# Patient Record
Sex: Female | Born: 1985 | Race: White | Hispanic: No | Smoking: Never smoker
Health system: Southern US, Community
[De-identification: ages and names within clinical notes are randomized; demographics above are authoritative.]

## PROBLEM LIST (undated history)

## (undated) DIAGNOSIS — Z789 Other specified health status: Secondary | ICD-10-CM

## (undated) DIAGNOSIS — R0609 Other forms of dyspnea: Secondary | ICD-10-CM

## (undated) DIAGNOSIS — R0600 Dyspnea, unspecified: Secondary | ICD-10-CM

## (undated) DIAGNOSIS — R011 Cardiac murmur, unspecified: Secondary | ICD-10-CM

## (undated) DIAGNOSIS — R001 Bradycardia, unspecified: Secondary | ICD-10-CM

## (undated) HISTORY — PX: TUBAL LIGATION: SHX77

## (undated) HISTORY — DX: Dyspnea, unspecified: R06.00

## (undated) HISTORY — DX: Other specified health status: Z78.9

## (undated) HISTORY — DX: Cardiac murmur, unspecified: R01.1

## (undated) HISTORY — DX: Bradycardia, unspecified: R00.1

## (undated) HISTORY — DX: Other forms of dyspnea: R06.09

## (undated) HISTORY — PX: APPENDECTOMY: SHX54

---

## 2019-02-24 ENCOUNTER — Other Ambulatory Visit: Payer: Self-pay | Admitting: Family Medicine

## 2019-02-24 DIAGNOSIS — N941 Unspecified dyspareunia: Secondary | ICD-10-CM

## 2019-04-08 NOTE — L&D Delivery Note (Signed)
Delivery Summary for Misty Chambers  Labor Events:   Preterm labor: No data found  Rupture date: 12/07/2019  Rupture time: 2:00 AM  Rupture type: Spontaneous  Fluid Color: Clear  Induction: No data found  Augmentation: No data found  Complications: No data found  Cervical ripening: No data found No data found   No data found     Delivery:   Episiotomy: No data found  Lacerations: No data found  Repair suture: No data found  Repair # of packets: No data found  Blood loss (ml): 205   Information for the patient's newborn:  Berklee, Battey [[id number]]    Delivery 12/07/2019 8:59 PM by  Vaginal, Spontaneous Sex:  female Gestational Age: [redacted]w[redacted]d Delivery Clinician:   Living?:         APGARS  One minute Five minutes Ten minutes  Skin color:        Heart rate:        Grimace:        Muscle tone:        Breathing:        Totals: 8  9      Presentation/position:      Resuscitation:   Cord information:    Disposition of cord blood:     Blood gases sent?  Complications:   Placenta: Delivered:       appearance Newborn Measurements: Weight: 8 lb 2.9 oz (3710 g)  Height: 20.28"  Head circumference:    Chest circumference:    Other providers:    Additional  information: Forceps:   Vacuum:   Breech:   Observed anomalies       Delivery Note At 8:59 PM a viable and healthy female was delivered via Vaginal, Spontaneous (Presentation: Right Occiput Anterior, Vertex).  APGAR: 8, 9; weight 3710 grams.   Placenta status: Spontaneous, Intact.  Cord: 3 vessels with the following complications: Nuchal cord x 1, loose, reducible after delivery of fetus.  Cord pH: not obtained.  Delayed cord clamping observed.  Cord blood obtained.   Anesthesia: Epidural Episiotomy: None Lacerations:  None Suture Repair: None Est. Blood Loss (mL): 205  Mom to postpartum.  Baby to Couplet care / Skin to Skin.  Hildred Laser, MD 12/07/2019, 9:27 PM

## 2019-04-14 ENCOUNTER — Ambulatory Visit: Payer: Medicaid Other | Attending: Internal Medicine

## 2019-04-14 DIAGNOSIS — Z20822 Contact with and (suspected) exposure to covid-19: Secondary | ICD-10-CM

## 2019-04-15 ENCOUNTER — Ambulatory Visit (INDEPENDENT_AMBULATORY_CARE_PROVIDER_SITE_OTHER): Payer: Medicaid Other | Admitting: Obstetrics and Gynecology

## 2019-04-15 ENCOUNTER — Encounter: Payer: Self-pay | Admitting: Obstetrics and Gynecology

## 2019-04-15 ENCOUNTER — Other Ambulatory Visit: Payer: Self-pay

## 2019-04-15 VITALS — BP 94/62 | HR 52 | Ht 64.0 in | Wt 150.8 lb

## 2019-04-15 DIAGNOSIS — N926 Irregular menstruation, unspecified: Secondary | ICD-10-CM | POA: Diagnosis not present

## 2019-04-15 LAB — POCT URINE PREGNANCY: Preg Test, Ur: POSITIVE — AB

## 2019-04-15 NOTE — Progress Notes (Signed)
PT is present today for confirmation of pregnancy. Pt LMP 02/21/19 unsure of date. UPT done today results were positive. Pt stated that she is doing well no complaints.

## 2019-04-15 NOTE — Progress Notes (Signed)
HPI:      [name] is a 34 y.o. G2P1001 who LMP was Patient's last menstrual period was 02/21/2019 (within days).  Subjective:   She presents today because she has missed 2 menstrual periods.  She did a positive pregnancy test at home on January 1.  She is not sure of her last menstrual period but believes it was sometime in mid November.  She is not currently taking prenatal vitamins.  She was not specifically attempting pregnancy. Her last pregnancy was approximately 10 years ago and was uncomplicated vaginal delivery. She did not breast-feed with the last pregnancy but she is willing to try this pregnancy.    Hx: The following portions of the patient's history were reviewed and updated as appropriate:             She  has a past medical history of No pertinent past medical history. She does not have a problem list on file. She  has a past surgical history that includes Appendectomy. Her family history includes COPD in her father; Diabetes in her mother; Heart attack in her paternal grandfather; Heart disease in her mother; Hepatitis in her sister. She  reports that she has never smoked. She has never used smokeless tobacco. She reports previous alcohol use. She reports previous drug use. Drug: Marijuana. She currently has no medications in their medication list. She has No Known Allergies.       Review of Systems:  Review of Systems  Constitutional: Denied constitutional symptoms, night sweats, recent illness, fatigue, fever, insomnia and weight loss.  Eyes: Denied eye symptoms, eye pain, photophobia, vision change and visual disturbance.  Ears/Nose/Throat/Neck: Denied ear, nose, throat or neck symptoms, hearing loss, nasal discharge, sinus congestion and sore throat.  Cardiovascular: Denied cardiovascular symptoms, arrhythmia, chest pain/pressure, edema, exercise intolerance, orthopnea and palpitations.  Respiratory: Denied pulmonary symptoms, asthma, pleuritic pain,  productive sputum, cough, dyspnea and wheezing.  Gastrointestinal: Denied, gastro-esophageal reflux, melena, nausea and vomiting.  Genitourinary: Denied genitourinary symptoms including symptomatic vaginal discharge, pelvic relaxation issues, and urinary complaints.  Musculoskeletal: Denied musculoskeletal symptoms, stiffness, swelling, muscle weakness and myalgia.  Dermatologic: Denied dermatology symptoms, rash and scar.  Neurologic: Denied neurology symptoms, dizziness, headache, neck pain and syncope.  Psychiatric: Denied psychiatric symptoms, anxiety and depression.  Endocrine: Denied endocrine symptoms including hot flashes and night sweats.   Meds:   No current outpatient medications on file prior to visit.   No current facility-administered medications on file prior to visit.    Objective:     Vitals:   04/15/19 1048  BP: 94/62  Pulse: (!) 52              Urinary pregnancy test positive  Assessment:    G2P1001 There are no problems to display for this patient.    1. Missed menses    Positive pregnancy test today in the office and a positive home test on January 1.   Plan:            Prenatal Plan 1.  The patient was given prenatal literature. 2.  She was begun on prenatal vitamins. 3.  A prenatal lab panel was ordered or drawn. 4.  An ultrasound was ordered to better determine an EDC. 5.  A nurse visit was scheduled. 6.  Genetic testing and testing for other inheritable conditions discussed in detail. She will decide in the future whether to have these labs performed. 7.  A general overview of pregnancy testing, visit schedule, ultrasound schedule,  and prenatal care was discussed. 8.  COVID and its risks associated with pregnancy, prevention by limiting exposure and use of masks, as well as the risks and benefits of vaccination during pregnancy were discussed in detail.  [facility] policy regarding office and hospital visitation and testing was explained. 9.   Benefits of breast-feeding discussed in detail including both maternal and infant benefits. Ready Set Baby website discussed.   Orders Orders Placed This Encounter  Procedures  . US OB Comp Less 14 Wks  . POCT urine pregnancy    No orders of the defined types were placed in this encounter.     F/U  Return in about 6 weeks (around 05/27/2019). I spent 32 minutes involved in the care of this patient preparing to see the patient by obtaining and reviewing her medical history (including labs, imaging tests and prior procedures), documenting clinical information in the electronic health record (EHR), writing and sending prescriptions, ordering tests or procedures and directly communicating with the patient discussing pertinent items from her history and physical exam as well as my assessment and plan as noted above.  All of her questions were answered.  Finis Bud, M.D. 04/15/2019 11:17 AM

## 2019-04-16 LAB — NOVEL CORONAVIRUS, NAA: SARS-CoV-2, NAA: NOT DETECTED

## 2019-04-27 NOTE — Progress Notes (Signed)
Misty Chambers presents for NOB nurse interview visit. Pregnancy confirmation done 04/15/19 DJE.  G-4 .  P-1 0 2 1. LMP 02/21/19. Pregnancy education material explained and given. 0 cats in the home. NOB labs ordered. HIV labs and Drug screen were explained optional and she did not decline. Drug screen ordered. PNV encouraged. Genetic screening options discussed. Genetic testing: Ordered. FMLA and financial papers reviewed and signed. Pt may discuss with provider. Pt. To follow up with provider in _4_ weeks for NOB physical.  All questions answered.

## 2019-04-28 ENCOUNTER — Ambulatory Visit (INDEPENDENT_AMBULATORY_CARE_PROVIDER_SITE_OTHER): Payer: Medicaid Other | Admitting: Obstetrics and Gynecology

## 2019-04-28 ENCOUNTER — Other Ambulatory Visit: Payer: Self-pay

## 2019-04-28 ENCOUNTER — Ambulatory Visit (INDEPENDENT_AMBULATORY_CARE_PROVIDER_SITE_OTHER): Payer: Medicaid Other

## 2019-04-28 VITALS — BP 116/72 | HR 73 | Ht 64.0 in | Wt 151.4 lb

## 2019-04-28 DIAGNOSIS — N926 Irregular menstruation, unspecified: Secondary | ICD-10-CM

## 2019-04-28 DIAGNOSIS — Z3687 Encounter for antenatal screening for uncertain dates: Secondary | ICD-10-CM

## 2019-04-28 DIAGNOSIS — Z3481 Encounter for supervision of other normal pregnancy, first trimester: Secondary | ICD-10-CM

## 2019-04-29 LAB — URINALYSIS, ROUTINE W REFLEX MICROSCOPIC
Bilirubin, UA: NEGATIVE
Glucose, UA: NEGATIVE
Ketones, UA: NEGATIVE
Nitrite, UA: NEGATIVE
Protein,UA: NEGATIVE
RBC, UA: NEGATIVE
Specific Gravity, UA: <=1.005 — AB (ref 1.005–1.030)
Urobilinogen, Ur: 0.2 mg/dL (ref 0.2–1.0)
pH, UA: 7 (ref 5.0–7.5)

## 2019-04-29 LAB — MICROSCOPIC EXAMINATION: Casts: NONE SEEN /lpf

## 2019-04-29 LAB — CBC WITH DIFFERENTIAL
Basophils Absolute: 0 10*3/uL (ref 0.0–0.2)
Basos: 0 %
EOS (ABSOLUTE): 0.1 10*3/uL (ref 0.0–0.4)
Eos: 2 %
Hematocrit: 37.5 % (ref 34.0–46.6)
Hemoglobin: 13.3 g/dL (ref 11.1–15.9)
Immature Grans (Abs): 0 10*3/uL (ref 0.0–0.1)
Immature Granulocytes: 0 %
Lymphocytes Absolute: 1.8 10*3/uL (ref 0.7–3.1)
Lymphs: 24 %
MCH: 32.8 pg (ref 26.6–33.0)
MCHC: 35.5 g/dL (ref 31.5–35.7)
MCV: 93 fL (ref 79–97)
Monocytes Absolute: 0.6 10*3/uL (ref 0.1–0.9)
Monocytes: 8 %
Neutrophils Absolute: 5 10*3/uL (ref 1.4–7.0)
Neutrophils: 66 %
RBC: 4.05 x10E6/uL (ref 3.77–5.28)
RDW: 12 % (ref 11.7–15.4)
WBC: 7.5 10*3/uL (ref 3.4–10.8)

## 2019-04-29 LAB — RUBELLA SCREEN: Rubella Antibodies, IGG: 1.14 index (ref >0.99)

## 2019-04-29 LAB — ABO AND RH: Rh Factor: POSITIVE

## 2019-04-29 LAB — HIV ANTIBODY (ROUTINE TESTING W REFLEX): HIV Screen 4th Generation wRfx: NONREACTIVE

## 2019-04-29 LAB — HEPATITIS B SURFACE ANTIGEN: Hepatitis B Surface Ag: NEGATIVE

## 2019-04-29 LAB — VARICELLA ZOSTER ANTIBODY, IGG: Varicella zoster IgG: >4000 index (ref >165)

## 2019-04-29 LAB — ANTIBODY SCREEN: Antibody Screen: NEGATIVE

## 2019-04-29 LAB — RPR: RPR Ser Ql: NONREACTIVE

## 2019-04-30 LAB — MONITOR DRUG PROFILE 14(MW)
Amphetamine Scrn, Ur: NEGATIVE ng/mL
BARBITURATE SCREEN URINE: NEGATIVE ng/mL
BENZODIAZEPINE SCREEN, URINE: NEGATIVE ng/mL
Buprenorphine, Urine: NEGATIVE ng/mL
CANNABINOIDS UR QL SCN: NEGATIVE ng/mL
Cocaine (Metab) Scrn, Ur: NEGATIVE ng/mL
Creatinine(Crt), U: 8.2 mg/dL — ABNORMAL LOW (ref 20.0–300.0)
Fentanyl, Urine: NEGATIVE pg/mL
Meperidine Screen, Urine: NEGATIVE ng/mL
Methadone Screen, Urine: NEGATIVE ng/mL
OXYCODONE+OXYMORPHONE UR QL SCN: NEGATIVE ng/mL
Opiate Scrn, Ur: NEGATIVE ng/mL
Ph of Urine: 6.8 (ref 4.5–8.9)
Phencyclidine Qn, Ur: NEGATIVE ng/mL
Propoxyphene Scrn, Ur: NEGATIVE ng/mL
SPECIFIC GRAVITY: 1.002
Tramadol Screen, Urine: NEGATIVE ng/mL

## 2019-04-30 LAB — GC/CHLAMYDIA PROBE AMP
Chlamydia trachomatis, NAA: NEGATIVE
Neisseria Gonorrhoeae by PCR: NEGATIVE

## 2019-04-30 LAB — URINE CULTURE: Organism ID, Bacteria: NO GROWTH

## 2019-05-02 LAB — MATERNIT 21 PLUS CORE, BLOOD
Fetal Fraction: 9
Result (T21): NEGATIVE
Trisomy 13 (Patau syndrome): NEGATIVE
Trisomy 18 (Edwards syndrome): NEGATIVE
Trisomy 21 (Down syndrome): NEGATIVE

## 2019-05-24 ENCOUNTER — Other Ambulatory Visit: Payer: Self-pay

## 2019-05-24 ENCOUNTER — Encounter: Payer: Self-pay | Admitting: Obstetrics and Gynecology

## 2019-05-24 ENCOUNTER — Ambulatory Visit (INDEPENDENT_AMBULATORY_CARE_PROVIDER_SITE_OTHER): Payer: Medicaid Other | Admitting: Obstetrics and Gynecology

## 2019-05-24 VITALS — BP 116/81 | HR 70 | Wt 153.0 lb

## 2019-05-24 DIAGNOSIS — Z3481 Encounter for supervision of other normal pregnancy, first trimester: Secondary | ICD-10-CM | POA: Diagnosis not present

## 2019-05-24 DIAGNOSIS — Z3A12 12 weeks gestation of pregnancy: Secondary | ICD-10-CM

## 2019-05-24 LAB — POCT URINALYSIS DIPSTICK OB
Bilirubin, UA: NEGATIVE
Blood, UA: NEGATIVE
Glucose, UA: NEGATIVE
Ketones, UA: NEGATIVE
Nitrite, UA: NEGATIVE
Spec Grav, UA: 1.01 (ref 1.010–1.025)
Urobilinogen, UA: 0.2 E.U./dL
pH, UA: 5 (ref 5.0–8.0)

## 2019-05-24 NOTE — Progress Notes (Signed)
NOB: Nausea and vomiting has now subsided.  Patient taking prenatal vitamins as directed.  Denies other problems.  AFP next visit.  If patient continues to have increased WBCs in urine consider repeat culture.  Physical examination General NAD, Conversant  HEENT Atraumatic; Op clear with mmm.  Normo-cephalic. Pupils reactive. Anicteric sclerae  Thyroid/Neck Smooth without nodularity or enlargement. Normal ROM.  Neck Supple.  Skin No rashes, lesions or ulceration. Normal palpated skin turgor. No nodularity.  Breasts: No masses or discharge.  Symmetric.  No axillary adenopathy.  Lungs: Clear to auscultation.No rales or wheezes. Normal Respiratory effort, no retractions.  Heart: NSR.  No murmurs or rubs appreciated. No periferal edema  Abdomen: Soft.  Non-tender.  No masses.  No HSM. No hernia  Extremities: Moves all appropriately.  Normal ROM for age. No lymphadenopathy.  Neuro: Oriented to PPT.  Normal mood. Normal affect.     Pelvic:   Vulva: Normal appearance.  No lesions.  Vagina: No lesions or abnormalities noted.  Support: Normal pelvic support.  Urethra No masses tenderness or scarring.  Meatus Normal size without lesions or prolapse.  Cervix: Normal appearance.  No lesions.  Anus: Normal exam.  No lesions.  Perineum: Normal exam.  No lesions.        Bimanual   Adnexae: No masses.  Non-tender to palpation.  Uterus: Enlarged. 142bpm  Non-tender.  Mobile.  AV.  Adnexae: No masses.  Non-tender to palpation.  Cul-de-sac: Negative for abnormality.  Adnexae: No masses.  Non-tender to palpation.         Pelvimetry   Diagonal: Reached.  Spines: Average.  Sacrum: Concave.  Pubic Arch: Normal.

## 2019-06-08 ENCOUNTER — Other Ambulatory Visit: Payer: Self-pay

## 2019-06-08 MED ORDER — VITAFOL GUMMIES 3.33-0.333-34.8 MG PO CHEW: 34.8000 mg | CHEWABLE_TABLET | Freq: Every day | ORAL | 11 refills | Status: DC

## 2019-06-20 NOTE — Progress Notes (Addendum)
ROB-Pt present for routine prenatal care. Pt stated that she was doing go other than having gas off and on. Pt stated that she takes gas x and it help a lot. Ready set baby completed today. Pt declined flu vaccine.

## 2019-06-20 NOTE — Patient Instructions (Signed)
Second Trimester of Pregnancy  The second trimester is from week 14 through week 27 (month 4 through 6). This is often the time in pregnancy that you feel your best. Often times, morning sickness has lessened or quit. You may have more energy, and you may get hungry more often. Your unborn baby is growing rapidly. At the end of the sixth month, he or she is about 9 inches long and weighs about 1 pounds. You will likely feel the baby move between 18 and 20 weeks of pregnancy. Follow these instructions at home: Medicines  Take over-the-counter and prescription medicines only as told by your doctor. Some medicines are safe and some medicines are not safe during pregnancy.  Take a prenatal vitamin that contains at least 600 micrograms (mcg) of folic acid.  If you have trouble pooping (constipation), take medicine that will make your stool soft (stool softener) if your doctor approves. Eating and drinking   Eat regular, healthy meals.  Avoid raw meat and uncooked cheese.  If you get low calcium from the food you eat, talk to your doctor about taking a daily calcium supplement.  Avoid foods that are high in fat and sugars, such as fried and sweet foods.  If you feel sick to your stomach (nauseous) or throw up (vomit): ? Eat 4 or 5 small meals a day instead of 3 large meals. ? Try eating a few soda crackers. ? Drink liquids between meals instead of during meals.  To prevent constipation: ? Eat foods that are high in fiber, like fresh fruits and vegetables, whole grains, and beans. ? Drink enough fluids to keep your pee (urine) clear or pale yellow. Activity  Exercise only as told by your doctor. Stop exercising if you start to have cramps.  Do not exercise if it is too hot, too humid, or if you are in a place of great height (high altitude).  Avoid heavy lifting.  Wear low-heeled shoes. Sit and stand up straight.  You can continue to have sex unless your doctor tells you not  to. Relieving pain and discomfort  Wear a good support bra if your breasts are tender.  Take warm water baths (sitz baths) to soothe pain or discomfort caused by hemorrhoids. Use hemorrhoid cream if your doctor approves.  Rest with your legs raised if you have leg cramps or low back pain.  If you develop puffy, bulging veins (varicose veins) in your legs: ? Wear support hose or compression stockings as told by your doctor. ? Raise (elevate) your feet for 15 minutes, 3-4 times a day. ? Limit salt in your food. Prenatal care  Write down your questions. Take them to your prenatal visits.  Keep all your prenatal visits as told by your doctor. This is important. Safety  Wear your seat belt when driving.  Make a list of emergency phone numbers, including numbers for family, friends, the hospital, and police and fire departments. General instructions  Ask your doctor about the right foods to eat or for help finding a counselor, if you need these services.  Ask your doctor about local prenatal classes. Begin classes before month 6 of your pregnancy.  Do not use hot tubs, steam rooms, or saunas.  Do not douche or use tampons or scented sanitary pads.  Do not cross your legs for long periods of time.  Visit your dentist if you have not done so. Use a soft toothbrush to brush your teeth. Floss gently.  Avoid all smoking, herbs,   and alcohol. Avoid drugs that are not approved by your doctor.  Do not use any products that contain nicotine or tobacco, such as cigarettes and e-cigarettes. If you need help quitting, ask your doctor.  Avoid cat litter boxes and soil used by cats. These carry germs that can cause birth defects in the baby and can cause a loss of your baby (miscarriage) or stillbirth. Contact a doctor if:  You have mild cramps or pressure in your lower belly.  You have pain when you pee (urinate).  You have bad smelling fluid coming from your vagina.  You continue to  feel sick to your stomach (nauseous), throw up (vomit), or have watery poop (diarrhea).  You have a nagging pain in your belly area.  You feel dizzy. Get help right away if:  You have a fever.  You are leaking fluid from your vagina.  You have spotting or bleeding from your vagina.  You have severe belly cramping or pain.  You lose or gain weight rapidly.  You have trouble catching your breath and have chest pain.  You notice sudden or extreme puffiness (swelling) of your face, hands, ankles, feet, or legs.  You have not felt the baby move in over an hour.  You have severe headaches that do not go away when you take medicine.  You have trouble seeing. Summary  The second trimester is from week 14 through week 27 (months 4 through 6). This is often the time in pregnancy that you feel your best.  To take care of yourself and your unborn baby, you will need to eat healthy meals, take medicines only if your doctor tells you to do so, and do activities that are safe for you and your baby.  Call your doctor if you get sick or if you notice anything unusual about your pregnancy. Also, call your doctor if you need help with the right food to eat, or if you want to know what activities are safe for you. This information is not intended to replace advice given to you by your health care provider. Make sure you discuss any questions you have with your health care provider. Document Revised: 07/16/2018 Document Reviewed: 04/29/2016 Elsevier Patient Education  [place]. Common Medications Safe in Pregnancy  Acne:      Constipation:  Benzoyl Peroxide     Colace  Clindamycin      Dulcolax Suppository  Topica Erythromycin     Fibercon  Salicylic Acid      Metamucil         Miralax AVOID:        Senakot   Accutane    Cough:  Retin-A       Cough Drops  Tetracycline      Phenergan w/ Codeine if Rx  Minocycline      Robitussin (Plain &  DM)  Antibiotics:     Crabs/Lice:  Ceclor       RID  Cephalosporins    AVOID:  E-Mycins      Kwell  Keflex  Macrobid/Macrodantin   Diarrhea:  Penicillin      Kao-Pectate  Zithromax      Imodium AD         PUSH FLUIDS AVOID:       Cipro     Fever:  Tetracycline      Tylenol (Regular or Extra  Minocycline       Strength)  Levaquin      Extra Strength-Do not  Exceed 8 tabs/24 hrs Caffeine:        <235m/day (equiv. To 1 cup of coffee or  approx. 3 12 oz sodas)         Gas: Cold/Hayfever:       Gas-X  Benadryl      Mylicon  Claritin       Phazyme  **Claritin-D        Chlor-Trimeton    Headaches:  Dimetapp      ASA-Free Excedrin  Drixoral-Non-Drowsy     Cold Compress  Mucinex (Guaifenasin)     Tylenol (Regular or Extra  Sudafed/Sudafed-12 Hour     Strength)  **Sudafed PE Pseudoephedrine   Tylenol Cold & Sinus     Vicks Vapor Rub  Zyrtec  **AVOID if Problems With Blood Pressure         Heartburn: Avoid lying down for at least 1 hour after meals  Aciphex      Maalox     Rash:  Milk of Magnesia     Benadryl    Mylanta       1% Hydrocortisone Cream  Pepcid  Pepcid Complete   Sleep Aids:  Prevacid      Ambien   Prilosec       Benadryl  Rolaids       Chamomile Tea  Tums (Limit 4/day)     Unisom  Zantac       Tylenol PM         Warm milk-add vanilla or  Hemorrhoids:       Sugar for taste  Anusol/Anusol H.C.  (RX: Analapram 2.5%)  Sugar Substitutes:  Hydrocortisone OTC     Ok in moderation  Preparation H      Tucks        Vaseline lotion applied to tissue with wiping    Herpes:     Throat:  Acyclovir      Oragel  Famvir  Valtrex     Vaccines:         Flu Shot Leg Cramps:       *Gardasil  Benadryl      Hepatitis A         Hepatitis B Nasal Spray:       Pneumovax  Saline Nasal Spray     Polio Booster         Tetanus Nausea:       Tuberculosis test or PPD  Vitamin B6 25 mg TID   AVOID:    Dramamine      *Gardasil  Emetrol       Live  Poliovirus  Ginger Root 250 mg QID    MMR (measles, mumps &  High Complex Carbs @ Bedtime    rebella)  Sea Bands-Accupressure    Varicella (Chickenpox)  Unisom 1/2 tab TID     *No known complications           If received before Pain:         Known pregnancy;   Darvocet       Resume series after  Lortab        Delivery  Percocet    Yeast:   Tramadol      Femstat  Tylenol 3      Gyne-lotrimin  Ultram       Monistat  Vicodin           MISC:         All Sunscreens  Hair Coloring/highlights          Insect Repellant's          (Including DEET)         Mystic Tans  

## 2019-06-21 ENCOUNTER — Ambulatory Visit (INDEPENDENT_AMBULATORY_CARE_PROVIDER_SITE_OTHER): Payer: Medicaid Other | Admitting: Obstetrics and Gynecology

## 2019-06-21 ENCOUNTER — Encounter: Payer: Self-pay | Admitting: Obstetrics and Gynecology

## 2019-06-21 ENCOUNTER — Other Ambulatory Visit: Payer: Self-pay

## 2019-06-21 VITALS — BP 104/60 | HR 70 | Wt 157.1 lb

## 2019-06-21 DIAGNOSIS — Z3A16 16 weeks gestation of pregnancy: Secondary | ICD-10-CM

## 2019-06-21 DIAGNOSIS — Z8669 Personal history of other diseases of the nervous system and sense organs: Secondary | ICD-10-CM

## 2019-06-21 DIAGNOSIS — O09522 Supervision of elderly multigravida, second trimester: Secondary | ICD-10-CM

## 2019-06-21 DIAGNOSIS — O2612 Low weight gain in pregnancy, second trimester: Secondary | ICD-10-CM | POA: Insufficient documentation

## 2019-06-21 DIAGNOSIS — Z1379 Encounter for other screening for genetic and chromosomal anomalies: Secondary | ICD-10-CM

## 2019-06-21 LAB — POCT URINALYSIS DIPSTICK OB
Bilirubin, UA: NEGATIVE
Blood, UA: NEGATIVE
Glucose, UA: NEGATIVE
Ketones, UA: NEGATIVE
Nitrite, UA: NEGATIVE
POC,PROTEIN,UA: NEGATIVE
Spec Grav, UA: 1.015 (ref 1.010–1.025)
Urobilinogen, UA: 0.2 E.U./dL
pH, UA: 6 (ref 5.0–8.0)

## 2019-06-21 NOTE — Progress Notes (Signed)
ROB: Patient doing well no major complaints. Does note some increased gas, taking gas-x.  Has questions regarding sciatic pain in pregnancy as she has a history of it in the past. All questions answered.  For AFP today. RTC in 4 weeks, due for anatomy scan. Declines flu vaccine.

## 2019-06-23 LAB — AFP, SERUM, OPEN SPINA BIFIDA
AFP MoM: 0.63
AFP Value: 21.4 ng/mL
Gest. Age on Collection Date: 16.4 weeks
Maternal Age At EDD: 35.4 yr
OSBR Risk 1 IN: 10000
Test Results:: NEGATIVE
Weight: 157 [lb_av]

## 2019-07-19 ENCOUNTER — Telehealth: Payer: Self-pay | Admitting: Obstetrics and Gynecology

## 2019-07-19 ENCOUNTER — Ambulatory Visit (INDEPENDENT_AMBULATORY_CARE_PROVIDER_SITE_OTHER): Payer: Medicaid Other | Admitting: Obstetrics and Gynecology

## 2019-07-19 ENCOUNTER — Other Ambulatory Visit: Payer: Self-pay

## 2019-07-19 ENCOUNTER — Ambulatory Visit (INDEPENDENT_AMBULATORY_CARE_PROVIDER_SITE_OTHER): Payer: Medicaid Other

## 2019-07-19 VITALS — BP 107/69 | HR 54 | Wt 157.0 lb

## 2019-07-19 DIAGNOSIS — O09522 Supervision of elderly multigravida, second trimester: Secondary | ICD-10-CM

## 2019-07-19 DIAGNOSIS — Z3A20 20 weeks gestation of pregnancy: Secondary | ICD-10-CM | POA: Diagnosis not present

## 2019-07-19 LAB — POCT URINALYSIS DIPSTICK OB
Bilirubin, UA: NEGATIVE
Blood, UA: NEGATIVE
Glucose, UA: NEGATIVE
Ketones, UA: NEGATIVE
Nitrite, UA: NEGATIVE
Spec Grav, UA: 1.02 (ref 1.010–1.025)
Urobilinogen, UA: 0.2 E.U./dL
pH, UA: 6.5 (ref 5.0–8.0)

## 2019-07-19 NOTE — Telephone Encounter (Signed)
Notified patient that she has refills at her pharmacy. She will need to call them.

## 2019-07-19 NOTE — Telephone Encounter (Signed)
Pt was checking out and was wanting a refil of her VITAFOL GUMMIES sent to [facility] on [address]. Please advise

## 2019-07-19 NOTE — Progress Notes (Signed)
ROB: Doing well-without issues.  No complaints.  FAS today-normal anatomy.

## 2019-08-08 ENCOUNTER — Ambulatory Visit: Payer: Medicaid Other | Attending: Internal Medicine

## 2019-08-08 DIAGNOSIS — Z20822 Contact with and (suspected) exposure to covid-19: Secondary | ICD-10-CM

## 2019-08-09 LAB — NOVEL CORONAVIRUS, NAA: SARS-CoV-2, NAA: NOT DETECTED

## 2019-08-09 LAB — SARS-COV-2, NAA 2 DAY TAT

## 2019-08-15 NOTE — Patient Instructions (Addendum)
Second Trimester of Pregnancy  The second trimester is from week 14 through week 27 (month 4 through 6). This is often the time in pregnancy that you feel your best. Often times, morning sickness has lessened or quit. You may have more energy, and you may get hungry more often. Your unborn baby is growing rapidly. At the end of the sixth month, he or she is about 9 inches long and weighs about 1 pounds. You will likely feel the baby move between 18 and 20 weeks of pregnancy. Follow these instructions at home: Medicines  Take over-the-counter and prescription medicines only as told by your doctor. Some medicines are safe and some medicines are not safe during pregnancy.  Take a prenatal vitamin that contains at least 600 micrograms (mcg) of folic acid.  If you have trouble pooping (constipation), take medicine that will make your stool soft (stool softener) if your doctor approves. Eating and drinking   Eat regular, healthy meals.  Avoid raw meat and uncooked cheese.  If you get low calcium from the food you eat, talk to your doctor about taking a daily calcium supplement.  Avoid foods that are high in fat and sugars, such as fried and sweet foods.  If you feel sick to your stomach (nauseous) or throw up (vomit): ? Eat 4 or 5 small meals a day instead of 3 large meals. ? Try eating a few soda crackers. ? Drink liquids between meals instead of during meals.  To prevent constipation: ? Eat foods that are high in fiber, like fresh fruits and vegetables, whole grains, and beans. ? Drink enough fluids to keep your pee (urine) clear or pale yellow. Activity  Exercise only as told by your doctor. Stop exercising if you start to have cramps.  Do not exercise if it is too hot, too humid, or if you are in a place of great height (high altitude).  Avoid heavy lifting.  Wear low-heeled shoes. Sit and stand up straight.  You can continue to have sex unless your doctor tells you not  to. Relieving pain and discomfort  Wear a good support bra if your breasts are tender.  Take warm water baths (sitz baths) to soothe pain or discomfort caused by hemorrhoids. Use hemorrhoid cream if your doctor approves.  Rest with your legs raised if you have leg cramps or low back pain.  If you develop puffy, bulging veins (varicose veins) in your legs: ? Wear support hose or compression stockings as told by your doctor. ? Raise (elevate) your feet for 15 minutes, 3-4 times a day. ? Limit salt in your food. Prenatal care  Write down your questions. Take them to your prenatal visits.  Keep all your prenatal visits as told by your doctor. This is important. Safety  Wear your seat belt when driving.  Make a list of emergency phone numbers, including numbers for family, friends, the hospital, and police and fire departments. General instructions  Ask your doctor about the right foods to eat or for help finding a counselor, if you need these services.  Ask your doctor about local prenatal classes. Begin classes before month 6 of your pregnancy.  Do not use hot tubs, steam rooms, or saunas.  Do not douche or use tampons or scented sanitary pads.  Do not cross your legs for long periods of time.  Visit your dentist if you have not done so. Use a soft toothbrush to brush your teeth. Floss gently.  Avoid all smoking, herbs,   and alcohol. Avoid drugs that are not approved by your doctor.  Do not use any products that contain nicotine or tobacco, such as cigarettes and e-cigarettes. If you need help quitting, ask your doctor.  Avoid cat litter boxes and soil used by cats. These carry germs that can cause birth defects in the baby and can cause a loss of your baby (miscarriage) or stillbirth. Contact a doctor if:  You have mild cramps or pressure in your lower belly.  You have pain when you pee (urinate).  You have bad smelling fluid coming from your vagina.  You continue to  feel sick to your stomach (nauseous), throw up (vomit), or have watery poop (diarrhea).  You have a nagging pain in your belly area.  You feel dizzy. Get help right away if:  You have a fever.  You are leaking fluid from your vagina.  You have spotting or bleeding from your vagina.  You have severe belly cramping or pain.  You lose or gain weight rapidly.  You have trouble catching your breath and have chest pain.  You notice sudden or extreme puffiness (swelling) of your face, hands, ankles, feet, or legs.  You have not felt the baby move in over an hour.  You have severe headaches that do not go away when you take medicine.  You have trouble seeing. Summary  The second trimester is from week 14 through week 27 (months 4 through 6). This is often the time in pregnancy that you feel your best.  To take care of yourself and your unborn baby, you will need to eat healthy meals, take medicines only if your doctor tells you to do so, and do activities that are safe for you and your baby.  Call your doctor if you get sick or if you notice anything unusual about your pregnancy. Also, call your doctor if you need help with the right food to eat, or if you want to know what activities are safe for you. This information is not intended to replace advice given to you by your health care provider. Make sure you discuss any questions you have with your health care provider. Document Revised: 07/16/2018 Document Reviewed: 04/29/2016 Elsevier Patient Education  2020 [employer] Common Medications Safe in Pregnancy  Acne:      Constipation:  Benzoyl Peroxide     Colace  Clindamycin      Dulcolax Suppository  Topica Erythromycin     Fibercon  Salicylic Acid      Metamucil         Miralax AVOID:        Senakot   Accutane    Cough:  Retin-A       Cough Drops  Tetracycline      Phenergan w/ Codeine if Rx  Minocycline      Robitussin (Plain &  DM)  Antibiotics:     Crabs/Lice:  Ceclor       RID  Cephalosporins    AVOID:  E-Mycins      Kwell  Keflex  Macrobid/Macrodantin   Diarrhea:  Penicillin      Kao-Pectate  Zithromax      Imodium AD         PUSH FLUIDS AVOID:       Cipro     Fever:  Tetracycline      Tylenol (Regular or Extra  Minocycline       Strength)  Levaquin      Extra Strength-Do not            Exceed 8 tabs/24 hrs Caffeine:        <235m/day (equiv. To 1 cup of coffee or  approx. 3 12 oz sodas)         Gas: Cold/Hayfever:       Gas-X  Benadryl      Mylicon  Claritin       Phazyme  **Claritin-D        Chlor-Trimeton    Headaches:  Dimetapp      ASA-Free Excedrin  Drixoral-Non-Drowsy     Cold Compress  Mucinex (Guaifenasin)     Tylenol (Regular or Extra  Sudafed/Sudafed-12 Hour     Strength)  **Sudafed PE Pseudoephedrine   Tylenol Cold & Sinus     Vicks Vapor Rub  Zyrtec  **AVOID if Problems With Blood Pressure         Heartburn: Avoid lying down for at least 1 hour after meals  Aciphex      Maalox     Rash:  Milk of Magnesia     Benadryl    Mylanta       1% Hydrocortisone Cream  Pepcid  Pepcid Complete   Sleep Aids:  Prevacid      Ambien   Prilosec       Benadryl  Rolaids       Chamomile Tea  Tums (Limit 4/day)     Unisom  Zantac       Tylenol PM         Warm milk-add vanilla or  Hemorrhoids:       Sugar for taste  Anusol/Anusol H.C.  (RX: Analapram 2.5%)  Sugar Substitutes:  Hydrocortisone OTC     Ok in moderation  Preparation H      Tucks        Vaseline lotion applied to tissue with wiping    Herpes:     Throat:  Acyclovir      Oragel  Famvir  Valtrex     Vaccines:         Flu Shot Leg Cramps:       *Gardasil  Benadryl      Hepatitis A         Hepatitis B Nasal Spray:       Pneumovax  Saline Nasal Spray     Polio Booster         Tetanus Nausea:       Tuberculosis test or PPD  Vitamin B6 25 mg TID   AVOID:    Dramamine      *Gardasil  Emetrol       Live  Poliovirus  Ginger Root 250 mg QID    MMR (measles, mumps &  High Complex Carbs @ Bedtime    rebella)  Sea Bands-Accupressure    Varicella (Chickenpox)  Unisom 1/2 tab TID     *No known complications           If received before Pain:         Known pregnancy;   Darvocet       Resume series after  Lortab        Delivery  Percocet    Yeast:   Tramadol      Femstat  Tylenol 3      Gyne-lotrimin  Ultram       Monistat  Vicodin           MISC:         All Sunscreens  Hair Coloring/highlights          Insect Repellant's          (Including DEET)         Mystic Tans Breastfeeding  Choosing to breastfeed is one of the best decisions you can make for yourself and your baby. A change in hormones during pregnancy causes your breasts to make breast milk in your milk-producing glands. Hormones prevent breast milk from being released before your baby is born. They also prompt milk flow after birth. Once breastfeeding has begun, thoughts of your baby, as well as his or her sucking or crying, can stimulate the release of milk from your milk-producing glands. Benefits of breastfeeding Research shows that breastfeeding offers many health benefits for infants and mothers. It also offers a cost-free and convenient way to feed your baby. For your baby  Your first milk (colostrum) helps your baby's digestive system to function better.  Special cells in your milk (antibodies) help your baby to fight off infections.  Breastfed babies are less likely to develop asthma, allergies, obesity, or type 2 diabetes. They are also at lower risk for sudden infant death syndrome (SIDS).  Nutrients in breast milk are better able to meet your baby's needs compared to infant formula.  Breast milk improves your baby's brain development. For you  Breastfeeding helps to create a very special bond between you and your baby.  Breastfeeding is convenient. Breast milk costs nothing and is always available at the  correct temperature.  Breastfeeding helps to burn calories. It helps you to lose the weight that you gained during pregnancy.  Breastfeeding makes your uterus return faster to its size before pregnancy. It also slows bleeding (lochia) after you give birth.  Breastfeeding helps to lower your risk of developing type 2 diabetes, osteoporosis, rheumatoid arthritis, cardiovascular disease, and breast, ovarian, uterine, and endometrial cancer later in life. Breastfeeding basics Starting breastfeeding  Find a comfortable place to sit or lie down, with your neck and back well-supported.  Place a pillow or a rolled-up blanket under your baby to bring him or her to the level of your breast (if you are seated). Nursing pillows are specially designed to help support your arms and your baby while you breastfeed.  Make sure that your baby's tummy (abdomen) is facing your abdomen.  Gently massage your breast. With your fingertips, massage from the outer edges of your breast inward toward the nipple. This encourages milk flow. If your milk flows slowly, you may need to continue this action during the feeding.  Support your breast with 4 fingers underneath and your thumb above your nipple (make the letter "C" with your hand). Make sure your fingers are well away from your nipple and your baby's mouth.  Stroke your baby's lips gently with your finger or nipple.  When your baby's mouth is open wide enough, quickly bring your baby to your breast, placing your entire nipple and as much of the areola as possible into your baby's mouth. The areola is the colored area around your nipple. ? More areola should be visible above your baby's upper lip than below the lower lip. ? Your baby's lips should be opened and extended outward (flanged) to ensure an adequate, comfortable latch. ? Your baby's tongue should be between his or her lower gum and your breast.  Make sure that your baby's mouth is correctly positioned  around your nipple (latched). Your baby's lips should create a seal on your breast and be turned   out (everted).  It is common for your baby to suck about 2-3 minutes in order to start the flow of breast milk. Latching Teaching your baby how to latch onto your breast properly is very important. An improper latch can cause nipple pain, decreased milk supply, and poor weight gain in your baby. Also, if your baby is not latched onto your nipple properly, he or she may swallow some air during feeding. This can make your baby fussy. Burping your baby when you switch breasts during the feeding can help to get rid of the air. However, teaching your baby to latch on properly is still the best way to prevent fussiness from swallowing air while breastfeeding. Signs that your baby has successfully latched onto your nipple  Silent tugging or silent sucking, without causing you pain. Infant's lips should be extended outward (flanged).  Swallowing heard between every 3-4 sucks once your milk has started to flow (after your let-down milk reflex occurs).  Muscle movement above and in front of his or her ears while sucking. Signs that your baby has not successfully latched onto your nipple  Sucking sounds or smacking sounds from your baby while breastfeeding.  Nipple pain. If you think your baby has not latched on correctly, slip your finger into the corner of your baby's mouth to break the suction and place it between your baby's gums. Attempt to start breastfeeding again. Signs of successful breastfeeding Signs from your baby  Your baby will gradually decrease the number of sucks or will completely stop sucking.  Your baby will fall asleep.  Your baby's body will relax.  Your baby will retain a small amount of milk in his or her mouth.  Your baby will let go of your breast by himself or herself. Signs from you  Breasts that have increased in firmness, weight, and size 1-3 hours after  feeding.  Breasts that are softer immediately after breastfeeding.  Increased milk volume, as well as a change in milk consistency and color by the fifth day of breastfeeding.  Nipples that are not sore, cracked, or bleeding. Signs that your baby is getting enough milk  Wetting at least 1-2 diapers during the first 24 hours after birth.  Wetting at least 5-6 diapers every 24 hours for the first week after birth. The urine should be clear or pale yellow by the age of 5 days.  Wetting 6-8 diapers every 24 hours as your baby continues to grow and develop.  At least 3 stools in a 24-hour period by the age of 5 days. The stool should be soft and yellow.  At least 3 stools in a 24-hour period by the age of 7 days. The stool should be seedy and yellow.  No loss of weight greater than 10% of birth weight during the first 3 days of life.  Average weight gain of 4-7 oz (113-198 g) per week after the age of 4 days.  Consistent daily weight gain by the age of 5 days, without weight loss after the age of 2 weeks. After a feeding, your baby may spit up a small amount of milk. This is normal. Breastfeeding frequency and duration Frequent feeding will help you make more milk and can prevent sore nipples and extremely full breasts (breast engorgement). Breastfeed when you feel the need to reduce the fullness of your breasts or when your baby shows signs of hunger. This is called "breastfeeding on demand." Signs that your baby is hungry include:  Increased alertness, activity,   or restlessness.  Movement of the head from side to side.  Opening of the mouth when the corner of the mouth or cheek is stroked (rooting).  Increased sucking sounds, smacking lips, cooing, sighing, or squeaking.  Hand-to-mouth movements and sucking on fingers or hands.  Fussing or crying. Avoid introducing a pacifier to your baby in the first 4-6 weeks after your baby is born. After this time, you may choose to use a  pacifier. Research has shown that pacifier use during the first year of a baby's life decreases the risk of sudden infant death syndrome (SIDS). Allow your baby to feed on each breast as long as he or she wants. When your baby unlatches or falls asleep while feeding from the first breast, offer the second breast. Because newborns are often sleepy in the first few weeks of life, you may need to awaken your baby to get him or her to feed. Breastfeeding times will vary from baby to baby. However, the following rules can serve as a guide to help you make sure that your baby is properly fed:  Newborns (babies 4 weeks of age or younger) may breastfeed every 1-3 hours.  Newborns should not go without breastfeeding for longer than 3 hours during the day or 5 hours during the night.  You should breastfeed your baby a minimum of 8 times in a 24-hour period. Breast milk pumping     Pumping and storing breast milk allows you to make sure that your baby is exclusively fed your breast milk, even at times when you are unable to breastfeed. This is especially important if you go back to work while you are still breastfeeding, or if you are not able to be present during feedings. Your lactation consultant can help you find a method of pumping that works best for you and give you guidelines about how long it is safe to store breast milk. Caring for your breasts while you breastfeed Nipples can become dry, cracked, and sore while breastfeeding. The following recommendations can help keep your breasts moisturized and healthy:  Avoid using soap on your nipples.  Wear a supportive bra designed especially for nursing. Avoid wearing underwire-style bras or extremely tight bras (sports bras).  Air-dry your nipples for 3-4 minutes after each feeding.  Use only cotton bra pads to absorb leaked breast milk. Leaking of breast milk between feedings is normal.  Use lanolin on your nipples after breastfeeding. Lanolin  helps to maintain your skin's normal moisture barrier. Pure lanolin is not harmful (not toxic) to your baby. You may also hand express a few drops of breast milk and gently massage that milk into your nipples and allow the milk to air-dry. In the first few weeks after giving birth, some women experience breast engorgement. Engorgement can make your breasts feel heavy, warm, and tender to the touch. Engorgement peaks within 3-5 days after you give birth. The following recommendations can help to ease engorgement:  Completely empty your breasts while breastfeeding or pumping. You may want to start by applying warm, moist heat (in the shower or with warm, water-soaked hand towels) just before feeding or pumping. This increases circulation and helps the milk flow. If your baby does not completely empty your breasts while breastfeeding, pump any extra milk after he or she is finished.  Apply ice packs to your breasts immediately after breastfeeding or pumping, unless this is too uncomfortable for you. To do this: ? Put ice in a plastic bag. ? Place a   Place a towel between your skin and the bag. ? Leave the ice on for 20 minutes, 2-3 times a day.  Make sure that your baby is latched on and positioned properly while breastfeeding. If engorgement persists after 48 hours of following these recommendations, contact your health care provider or a Science writer. Overall health care recommendations while breastfeeding  Eat 3 healthy meals and 3 snacks every day. Well-nourished mothers who are breastfeeding need an additional 450-500 calories a day. You can meet this requirement by increasing the amount of a balanced diet that you eat.  Drink enough water to keep your urine pale yellow or clear.  Rest often, relax, and continue to take your prenatal vitamins to prevent fatigue, stress, and low vitamin and mineral levels in your body (nutrient deficiencies).  Do not use any products that contain nicotine or  tobacco, such as cigarettes and e-cigarettes. Your baby may be harmed by chemicals from cigarettes that pass into breast milk and exposure to secondhand smoke. If you need help quitting, ask your health care provider.  Avoid alcohol.  Do not use illegal drugs or marijuana.  Talk with your health care provider before taking any medicines. These include over-the-counter and prescription medicines as well as vitamins and herbal supplements. Some medicines that may be harmful to your baby can pass through breast milk.  It is possible to become pregnant while breastfeeding. If birth control is desired, ask your health care provider about options that will be safe while breastfeeding your baby. Where to find more information: [employer]: www.llli.org Contact a health care provider if:  You feel like you want to stop breastfeeding or have become frustrated with breastfeeding.  Your nipples are cracked or bleeding.  Your breasts are red, tender, or warm.  You have: ? Painful breasts or nipples. ? A swollen area on either breast. ? A fever or chills. ? Nausea or vomiting. ? Drainage other than breast milk from your nipples.  Your breasts do not become full before feedings by the fifth day after you give birth.  You feel sad and depressed.  Your baby is: ? Too sleepy to eat well. ? Having trouble sleeping. ? More than 42 week old and wetting fewer than 6 diapers in a 24-hour period. ? Not gaining weight by 66 days of age.  Your baby has fewer than 3 stools in a 24-hour period.  Your baby's skin or the white parts of his or her eyes become yellow. Get help right away if:  Your baby is overly tired (lethargic) and does not want to wake up and feed.  Your baby develops an unexplained fever. Summary  Breastfeeding offers many health benefits for infant and mothers.  Try to breastfeed your infant when he or she shows early signs of hunger.  Gently tickle or stroke  your baby's lips with your finger or nipple to allow the baby to open his or her mouth. Bring the baby to your breast. Make sure that much of the areola is in your baby's mouth. Offer one side and burp the baby before you offer the other side.  Talk with your health care provider or lactation consultant if you have questions or you face problems as you breastfeed. This information is not intended to replace advice given to you by your health care provider. Make sure you discuss any questions you have with your health care provider. Document Revised: 06/18/2017 Document Reviewed: 04/25/2016 Elsevier Patient Education  [place].  GESTATIONAL DIABETES TESTING FOR PREGNANCY  Pregnant women can develop a condition known as Gestational Diabetes (diabetes brought on by pregnancy) which can pose a risk to both mother and baby. A glucose tolerance test is a common type of testing for potential gestational diabetes.  There are several tests intended to identify gestational diabetes in pregnant women. The first, called the Glucose Challenge Screening, is a preliminary screening test performed between 26-28 weeks. If a woman tests positive during this screening test, the second test, called the Glucose Tolerance Test, may be performed. This test will diagnose whether diabetes exists or not by indicating whether or not the body is using glucose (a type of sugar) effectively.  The Glucose Challenge Screening is now considered to be a standard test performed during the early part of the third trimester of pregnancy.  What is the Glucose Challenge Screening Test? No preparation is required prior to the test. During the test, the mother is asked to drink a sweet liquid (glucose) and then will have blood drawn one hour from having the drink, as blood glucose levels normally peak within one hour. No fasting is required prior to this test.  The test evaluates how your body processes sugar. A high level  in your blood may indicate your body is not processing sugar effectively (positive test). If the results of this screen are positive, the woman may have the Glucose Tolerance Test performed. It is important to note that not all women who test positive for the Glucose Challenge Screening test are found to have diabetes upon further diagnosis.  What is the Glucose Tolerance Test? Prior to the taking the glucose tolerance test, your doctor will ask you to make sure and eat at least '150mg'$  of carbohydrates (about what you will get from a slice or two of bread) for three days prior to the time you will be asked to fast. You will not be permitted to eat or drink anything but sips of water for 14 hours prior to the test, so it is best to schedule the test for first thing in the morning.  Additionally, you should plan to have someone drive you to and from the test since your energy levels may be low and there is a slight possibility you may feel light-headed.  When you arrive, the technician will draw blood to measure your baseline "fasting blood glucose level". You will be asked to drink a larger volume (or more concentrated solution) of the glucose drink than was used in the initial Glucose Challenge Screening test. Your blood will be drawn and tested every hour for the next three hours.  The following are the values that the American Diabetes Association considers to be abnormal during the Glucose Tolerance Test:  Interval Abnormal reading Fasting 95 mg/dl or higher One hour 180 mg/dl or higher Two hours 155 mg/dl or higher Three hours 140 mg/dl or higher  What if my Glucose Tolerance Test Results are Abnormal? If only one of your readings comes back abnormal, your doctor may suggest some changes to your diet and/or test you again later in the pregnancy. If two or more of your readings come back abnormal, you'll be diagnosed with Gestational Diabetes and your doctor or midwife will talk to you about a  treatment plan. Treating diabetes during pregnancy is extremely important to protect the health of both mother and baby.   Compiled using information from the following sources:  1. American Diabetes Association  https://www.diabetes.org  2. Emedicine  https://www.emedicine.com  [place] [facility] Diabetes and Digestive [facility] Kidney Diseases

## 2019-08-15 NOTE — Progress Notes (Signed)
ROB-Pt present for routine prenatal care. Pt stated that she was doing well other than have some pelvic pain off and on and cramping while she is at work.

## 2019-08-16 ENCOUNTER — Encounter: Payer: Self-pay | Admitting: Obstetrics and Gynecology

## 2019-08-16 ENCOUNTER — Ambulatory Visit (INDEPENDENT_AMBULATORY_CARE_PROVIDER_SITE_OTHER): Payer: Medicaid Other | Admitting: Obstetrics and Gynecology

## 2019-08-16 ENCOUNTER — Other Ambulatory Visit: Payer: Self-pay

## 2019-08-16 VITALS — BP 117/64 | HR 56 | Wt 163.8 lb

## 2019-08-16 DIAGNOSIS — Z3482 Encounter for supervision of other normal pregnancy, second trimester: Secondary | ICD-10-CM

## 2019-08-16 DIAGNOSIS — Z131 Encounter for screening for diabetes mellitus: Secondary | ICD-10-CM

## 2019-08-16 DIAGNOSIS — N949 Unspecified condition associated with female genital organs and menstrual cycle: Secondary | ICD-10-CM

## 2019-08-16 DIAGNOSIS — Z130 Encounter for screening for diseases of the blood and blood-forming organs and certain disorders involving the immune mechanism: Secondary | ICD-10-CM

## 2019-08-16 DIAGNOSIS — R001 Bradycardia, unspecified: Secondary | ICD-10-CM

## 2019-08-16 DIAGNOSIS — Z3A24 24 weeks gestation of pregnancy: Secondary | ICD-10-CM

## 2019-08-16 LAB — POCT URINALYSIS DIPSTICK OB
Bilirubin, UA: NEGATIVE
Blood, UA: NEGATIVE
Glucose, UA: NEGATIVE
Ketones, UA: NEGATIVE
Nitrite, UA: NEGATIVE
POC,PROTEIN,UA: NEGATIVE
Spec Grav, UA: 1.01 (ref 1.010–1.025)
Urobilinogen, UA: 0.2 E.U./dL
pH, UA: 7.5 (ref 5.0–8.0)

## 2019-08-16 NOTE — Progress Notes (Signed)
ROB: Patient noting pain in groin with movement and cramping, likely round ligament pain. Discussed patient to f/u with EKG for bradycardia in pregnancy. RTC in 4 weeks, for 28 week labs at that time.

## 2019-08-16 NOTE — Progress Notes (Signed)
New Outpatient Visit Date: 08/17/2019  Referring Provider: Hildred Laser, MD [address] [place],  [place] [address]  Chief Complaint: Low heart rate  HPI:  Misty Chambers is a 34 y.o. pregnant female ([redacted]w[redacted]d) who is being seen today for the evaluation of bradycardia at the request of Dr. Valentino Saxon. She has no significant past medical history.  Over the last few months, Misty Chambers has noticed intermittent "brain fog."  She recently reviewed MyChart and noted low heart rates and became concerned that bradycardia may be related to her "brain fog."  During these episodes, she also notes dizziness, as if feeling off balance.  Over the last few months, she has also experienced increasing exertional dyspnea and fatigue.  While she suspects that this is largely due to being pregnant, these symptoms a much more noticeable than during her first pregnancy 10 years ago.  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  She denies chest pain, palpitations, orthopnea, and edema but has experienced a few episodes of PND over the last 1.5 months.  She used to smoke marijuana on a regular basis but stopped when she became pregnant.  --------------------------------------------------------------------------------------------------  Cardiovascular History & Procedures: Cardiovascular Problems:  Bradycardia  Dyspnea on exertion  Heart murmur  Risk Factors:  Family history  Cath/PCI:  None  CV Surgery:  None  EP Procedures and Devices:  None  Non-Invasive Evaluation(s):  None  --------------------------------------------------------------------------------------------------  Past Medical History:  Diagnosis Date  . No pertinent past medical history     Past Surgical History:  Procedure Laterality Date  . APPENDECTOMY      Current  Meds  Medication Sig  . Prenatal Vit-Fe Phos-FA-Omega (VITAFOL GUMMIES) 3.33-0.333-34.8 MG CHEW Chew 34.8 mg by mouth daily. Chew 3 gummies once a day.    Allergies: Patient has no known allergies.  Social History   Tobacco Use  . Smoking status: Never Smoker  . Smokeless tobacco: Never Used  Substance Use Topics  . Alcohol use: Not Currently  . Drug use: Not Currently    Types: Marijuana    Family History  Problem Relation Age of Onset  . Diabetes Mother   . Heart disease Mother 69       CABG  . COPD Father   . Hepatitis Sister   . Heart attack Paternal Grandfather     Review of Systems: Misty Chambers reports frequent numbness in her 3rd and 4th toes (L>R) when standing for extended periods or driving.  Otherwise, a 12-system review of systems was performed and was negative except as noted in the HPI.  --------------------------------------------------------------------------------------------------  Physical Exam: BP 118/78 (BP Location: Right Arm, Patient Position: Sitting, Cuff Size: Normal)   Pulse 71   Ht 5\' 4"  (1.626 m)   Wt 160 lb 8 oz (72.8 kg)   LMP 02/21/2019 (Within Days)   SpO2 98%   BMI 27.55 kg/m   General:  NAD. HEENT: No conjunctival pallor or scleral icterus. Facemask in place. Neck: Supple without lymphadenopathy, thyromegaly, JVD, or HJR. No carotid bruit. Lungs: Normal work of breathing. Clear to auscultation bilaterally without wheezes or crackles. Heart: Regular rate and rhythm with 2/6 holosystolic murmur loudest at the LLSB.  No rubs or gallops. Non-displaced PMI. Abd: Bowel sounds present. Soft, NT/ND without hepatosplenomegaly.  Gravid uterus noted. Ext: No lower extremity edema. Radial, PT,  and DP pulses are 2+ bilaterally Skin: Warm and dry without rash. Neuro: CNIII-XII intact. Strength and fine-touch sensation intact in upper and lower extremities bilaterally. Psych: Normal mood and affect.  EKG:  Normal sinus rhythm with  borderline low voltage.  Otherwise, no significant abnormality.  Lab Results  Component Value Date   WBC 7.5 04/28/2019   HGB 13.3 04/28/2019   HCT 37.5 04/28/2019   MCV 93 04/28/2019   --------------------------------------------------------------------------------------------------  ASSESSMENT AND PLAN: Bradycardia, dyspnea on exertion, and heart murmur: I suspect symptoms are largely due to being pregnant, though bradycardia is somewhat unusual, as heart rate typically increases during pregnancy.  However, the could be an element of increased vagal stimulation contributing to low heart rates.  EKG today (including HR) is normal today.  Exam is notable for a systolic murmur.  There are no other signs of heart failure.  I have recommended that we check a CBC, BMP, Mg, and TSH, as well as a transthoracic echocardiogram.  While Misty Chambers has a history of premature CAD in her mother, her constellation of symptoms and otherwise absence of risk factors make ischemic heart disease unlikely.  Follow-up: Return to clinic in 2 months.  Nelva Bush, MD 08/17/2019 2:06 PM

## 2019-08-17 ENCOUNTER — Ambulatory Visit (INDEPENDENT_AMBULATORY_CARE_PROVIDER_SITE_OTHER): Payer: Medicaid Other | Admitting: Internal Medicine

## 2019-08-17 ENCOUNTER — Encounter: Payer: Self-pay | Admitting: Internal Medicine

## 2019-08-17 VITALS — BP 118/78 | HR 71 | Ht 64.0 in | Wt 160.5 lb

## 2019-08-17 DIAGNOSIS — R001 Bradycardia, unspecified: Secondary | ICD-10-CM | POA: Diagnosis not present

## 2019-08-17 DIAGNOSIS — R011 Cardiac murmur, unspecified: Secondary | ICD-10-CM

## 2019-08-17 DIAGNOSIS — R0600 Dyspnea, unspecified: Secondary | ICD-10-CM | POA: Diagnosis not present

## 2019-08-17 DIAGNOSIS — R0602 Shortness of breath: Secondary | ICD-10-CM | POA: Diagnosis not present

## 2019-08-17 DIAGNOSIS — R0609 Other forms of dyspnea: Secondary | ICD-10-CM | POA: Insufficient documentation

## 2019-08-17 NOTE — Patient Instructions (Signed)
Medication Instructions:  Your physician recommends that you continue on your current medications as directed. Please refer to the Current Medication list given to you today.  *If you need a refill on your cardiac medications before your next appointment, please call your pharmacy*   Lab Work: Your physician recommends that you return for lab work in: TODAY - CBC, CMP, TSH, Mg.  If you have labs (blood work) drawn today and your tests are completely normal, you will receive your results only by: Marland Kitchen MyChart Message (if you have MyChart) OR . A paper copy in the mail If you have any lab test that is abnormal or we need to change your treatment, we will call you to review the results.   Testing/Procedures: Your physician has requested that you have an echocardiogram. Echocardiography is a painless test that uses sound waves to create images of your heart. It provides your doctor with information about the size and shape of your heart and how well your heart's chambers and valves are working. This procedure takes approximately one hour. There are no restrictions for this procedure. You may get an IV, if needed, to receive an ultrasound enhancing agent through to better visualize your heart.    Follow-Up: At [facility], you and your health needs are our priority.  As part of our continuing mission to provide you with exceptional heart care, we have created designated Provider Care Teams.  These Care Teams include your primary Cardiologist (physician) and Advanced Practice Providers (APPs -  Physician Assistants and Nurse Practitioners) who all work together to provide you with the care you need, when you need it.  We recommend signing up for the patient portal called "MyChart".  Sign up information is provided on this After Visit Summary.  MyChart is used to connect with patients for Virtual Visits (Telemedicine).  Patients are able to view lab/test results, encounter notes, upcoming  appointments, etc.  Non-urgent messages can be sent to your provider as well.   To learn more about what you can do with MyChart, go to [email or web address].    Your next appointment:   2 month(s)  The format for your next appointment:   In Person  Provider:    You may see DR Cristal Deer END or one of the following Advanced Practice Providers on your designated Care Team:    Nicolasa Ducking, NP  Eula Listen, PA-C  Marisue Ivan, PA-C   Echocardiogram An echocardiogram is a procedure that uses painless sound waves (ultrasound) to produce an image of the heart. Images from an echocardiogram can provide important information about:  Signs of coronary artery disease (CAD).  Aneurysm detection. An aneurysm is a weak or damaged part of an artery wall that bulges out from the normal force of blood pumping through the body.  Heart size and shape. Changes in the size or shape of the heart can be associated with certain conditions, including heart failure, aneurysm, and CAD.  Heart muscle function.  Heart valve function.  Signs of a past heart attack.  Fluid buildup around the heart.  Thickening of the heart muscle.  A tumor or infectious growth around the heart valves. Tell a health care provider about:  Any allergies you have.  All medicines you are taking, including vitamins, herbs, eye drops, creams, and over-the-counter medicines.  Any blood disorders you have.  Any surgeries you have had.  Any medical conditions you have.  Whether you are pregnant or may be pregnant. What are the risks? Generally,  this is a safe procedure. However, problems may occur, including:  Allergic reaction to dye (contrast) that may be used during the procedure. What happens before the procedure? No specific preparation is needed. You may eat and drink normally. What happens during the procedure?   An IV tube may be inserted into one of your veins.  You may receive contrast  through this tube. A contrast is an injection that improves the quality of the pictures from your heart.  A gel will be applied to your chest.  A wand-like tool (transducer) will be moved over your chest. The gel will help to transmit the sound waves from the transducer.  The sound waves will harmlessly bounce off of your heart to allow the heart images to be captured in real-time motion. The images will be recorded on a computer. The procedure may vary among health care providers and hospitals. What happens after the procedure?  You may return to your normal, everyday life, including diet, activities, and medicines, unless your health care provider tells you not to do that. Summary  An echocardiogram is a procedure that uses painless sound waves (ultrasound) to produce an image of the heart.  Images from an echocardiogram can provide important information about the size and shape of your heart, heart muscle function, heart valve function, and fluid buildup around your heart.  You do not need to do anything to prepare before this procedure. You may eat and drink normally.  After the echocardiogram is completed, you may return to your normal, everyday life, unless your health care provider tells you not to do that. This information is not intended to replace advice given to you by your health care provider. Make sure you discuss any questions you have with your health care provider. Document Revised: 07/15/2018 Document Reviewed: 04/26/2016 Elsevier Patient Education  [employer].

## 2019-08-18 LAB — CBC
Hematocrit: 35.8 % (ref 34.0–46.6)
Hemoglobin: 12.2 g/dL (ref 11.1–15.9)
MCH: 32.4 pg (ref 26.6–33.0)
MCHC: 34.1 g/dL (ref 31.5–35.7)
MCV: 95 fL (ref 79–97)
Platelets: 264 10*3/uL (ref 150–450)
RBC: 3.77 x10E6/uL (ref 3.77–5.28)
RDW: 12.4 % (ref 11.7–15.4)
WBC: 10 10*3/uL (ref 3.4–10.8)

## 2019-08-18 LAB — TSH: TSH: 1.09 u[IU]/mL (ref 0.450–4.500)

## 2019-08-18 LAB — COMPREHENSIVE METABOLIC PANEL
ALT: 9 IU/L (ref 0–32)
AST: 14 IU/L (ref 0–40)
Albumin/Globulin Ratio: 1.3 (ref 1.2–2.2)
Albumin: 3.8 g/dL (ref 3.8–4.8)
Alkaline Phosphatase: 53 IU/L (ref 39–117)
BUN/Creatinine Ratio: 22 (ref 9–23)
BUN: 11 mg/dL (ref 6–20)
Bilirubin Total: <0.2 mg/dL (ref 0.0–1.2)
CO2: 20 mmol/L (ref 20–29)
Calcium: 9.3 mg/dL (ref 8.7–10.2)
Chloride: 101 mmol/L (ref 96–106)
Creatinine, Ser: 0.5 mg/dL — ABNORMAL LOW (ref 0.57–1.00)
GFR calc Af Amer: 145 mL/min/{1.73_m2} (ref >59)
GFR calc non Af Amer: 126 mL/min/{1.73_m2} (ref >59)
Globulin, Total: 3 g/dL (ref 1.5–4.5)
Glucose: 81 mg/dL (ref 65–99)
Potassium: 4 mmol/L (ref 3.5–5.2)
Sodium: 135 mmol/L (ref 134–144)
Total Protein: 6.8 g/dL (ref 6.0–8.5)

## 2019-08-18 LAB — MAGNESIUM: Magnesium: 1.9 mg/dL (ref 1.6–2.3)

## 2019-09-20 ENCOUNTER — Other Ambulatory Visit: Payer: Self-pay

## 2019-09-20 ENCOUNTER — Other Ambulatory Visit: Payer: Medicaid Other

## 2019-09-20 ENCOUNTER — Encounter: Payer: Self-pay | Admitting: Obstetrics and Gynecology

## 2019-09-20 ENCOUNTER — Ambulatory Visit (INDEPENDENT_AMBULATORY_CARE_PROVIDER_SITE_OTHER): Payer: Medicaid Other | Admitting: Obstetrics and Gynecology

## 2019-09-20 VITALS — BP 83/52 | HR 62 | Wt 166.1 lb

## 2019-09-20 DIAGNOSIS — R001 Bradycardia, unspecified: Secondary | ICD-10-CM

## 2019-09-20 DIAGNOSIS — Z131 Encounter for screening for diabetes mellitus: Secondary | ICD-10-CM

## 2019-09-20 DIAGNOSIS — Z3A29 29 weeks gestation of pregnancy: Secondary | ICD-10-CM

## 2019-09-20 DIAGNOSIS — Z23 Encounter for immunization: Secondary | ICD-10-CM | POA: Diagnosis not present

## 2019-09-20 DIAGNOSIS — R011 Cardiac murmur, unspecified: Secondary | ICD-10-CM

## 2019-09-20 DIAGNOSIS — Z130 Encounter for screening for diseases of the blood and blood-forming organs and certain disorders involving the immune mechanism: Secondary | ICD-10-CM

## 2019-09-20 DIAGNOSIS — Z3482 Encounter for supervision of other normal pregnancy, second trimester: Secondary | ICD-10-CM

## 2019-09-20 DIAGNOSIS — R0609 Other forms of dyspnea: Secondary | ICD-10-CM

## 2019-09-20 LAB — POCT URINALYSIS DIPSTICK OB
Bilirubin, UA: NEGATIVE
Blood, UA: NEGATIVE
Glucose, UA: NEGATIVE
Ketones, UA: NEGATIVE
Nitrite, UA: NEGATIVE
POC,PROTEIN,UA: NEGATIVE
Spec Grav, UA: 1.02 (ref 1.010–1.025)
Urobilinogen, UA: 0.2 E.U./dL
pH, UA: 7 (ref 5.0–8.0)

## 2019-09-20 NOTE — Progress Notes (Signed)
ROB: Patient doing well.  Continues to be bradycardic.  Appointment in 2 weeks.  Reports daily fetal movement.  1 hour GCT today.

## 2019-09-20 NOTE — Progress Notes (Signed)
ROB- Mild pelvic pain. Scheduled to see Cardiologist on  6/28 and f/u after. TDap, BTC, and GTT done today.

## 2019-09-21 LAB — CBC
Hematocrit: 30.9 % — ABNORMAL LOW (ref 34.0–46.6)
Hemoglobin: 10.8 g/dL — ABNORMAL LOW (ref 11.1–15.9)
MCH: 32.4 pg (ref 26.6–33.0)
MCHC: 35 g/dL (ref 31.5–35.7)
MCV: 93 fL (ref 79–97)
Platelets: 218 10*3/uL (ref 150–450)
RBC: 3.33 x10E6/uL — ABNORMAL LOW (ref 3.77–5.28)
RDW: 11.9 % (ref 11.7–15.4)
WBC: 8.5 10*3/uL (ref 3.4–10.8)

## 2019-09-21 LAB — GLUCOSE, 1 HOUR GESTATIONAL: Gestational Diabetes Screen: 135 mg/dL (ref 65–139)

## 2019-09-21 LAB — RPR: RPR Ser Ql: NONREACTIVE

## 2019-10-03 ENCOUNTER — Other Ambulatory Visit: Payer: Self-pay

## 2019-10-03 ENCOUNTER — Ambulatory Visit (INDEPENDENT_AMBULATORY_CARE_PROVIDER_SITE_OTHER): Payer: Medicaid Other

## 2019-10-03 DIAGNOSIS — R001 Bradycardia, unspecified: Secondary | ICD-10-CM

## 2019-10-03 DIAGNOSIS — R0600 Dyspnea, unspecified: Secondary | ICD-10-CM | POA: Diagnosis not present

## 2019-10-03 DIAGNOSIS — R011 Cardiac murmur, unspecified: Secondary | ICD-10-CM

## 2019-10-11 ENCOUNTER — Other Ambulatory Visit: Payer: Self-pay

## 2019-10-11 ENCOUNTER — Encounter: Payer: Self-pay | Admitting: Obstetrics and Gynecology

## 2019-10-11 ENCOUNTER — Ambulatory Visit (INDEPENDENT_AMBULATORY_CARE_PROVIDER_SITE_OTHER): Payer: Medicaid Other | Admitting: Obstetrics and Gynecology

## 2019-10-11 VITALS — BP 119/72 | HR 67 | Wt 166.1 lb

## 2019-10-11 DIAGNOSIS — Z3A32 32 weeks gestation of pregnancy: Secondary | ICD-10-CM

## 2019-10-11 DIAGNOSIS — Z3483 Encounter for supervision of other normal pregnancy, third trimester: Secondary | ICD-10-CM

## 2019-10-11 LAB — POCT URINALYSIS DIPSTICK OB
Bilirubin, UA: NEGATIVE
Blood, UA: NEGATIVE
Glucose, UA: NEGATIVE
Ketones, UA: NEGATIVE
Nitrite, UA: NEGATIVE
Spec Grav, UA: 1.02 (ref 1.010–1.025)
Urobilinogen, UA: 0.2 E.U./dL
pH, UA: 6.5 (ref 5.0–8.0)

## 2019-10-11 NOTE — Progress Notes (Signed)
ROB-Pt present for routine prenatal care. Pt stated noticing lower abd cramping this morning maybe braxton hick contractions.

## 2019-10-11 NOTE — Patient Instructions (Addendum)
Braxton Hicks Contractions Contractions of the uterus can occur throughout pregnancy, but they are not always a sign that you are in labor. You may have practice contractions called Braxton Hicks contractions. These false labor contractions are sometimes confused with true labor. What are Braxton Hicks contractions? Braxton Hicks contractions are tightening movements that occur in the muscles of the uterus before labor. Unlike true labor contractions, these contractions do not result in opening (dilation) and thinning of the cervix. Toward the end of pregnancy (32-34 weeks), Braxton Hicks contractions can happen more often and may become stronger. These contractions are sometimes difficult to tell apart from true labor because they can be very uncomfortable. You should not feel embarrassed if you go to the hospital with false labor. Sometimes, the only way to tell if you are in true labor is for your health care provider to look for changes in the cervix. The health care provider will do a physical exam and may monitor your contractions. If you are not in true labor, the exam should show that your cervix is not dilating and your water has not broken. If there are no other health problems associated with your pregnancy, it is completely safe for you to be sent home with false labor. You may continue to have Braxton Hicks contractions until you go into true labor. How to tell the difference between true labor and false labor True labor  Contractions last 30-70 seconds.  Contractions become very regular.  Discomfort is usually felt in the top of the uterus, and it spreads to the lower abdomen and low back.  Contractions do not go away with walking.  Contractions usually become more intense and increase in frequency.  The cervix dilates and gets thinner. False labor  Contractions are usually shorter and not as strong as true labor contractions.  Contractions are usually irregular.  Contractions  are often felt in the front of the lower abdomen and in the groin.  Contractions may go away when you walk around or change positions while lying down.  Contractions get weaker and are shorter-lasting as time goes on.  The cervix usually does not dilate or become thin. Follow these instructions at home:   Take over-the-counter and prescription medicines only as told by your health care provider.  Keep up with your usual exercises and follow other instructions from your health care provider.  Eat and drink lightly if you think you are going into labor.  If Braxton Hicks contractions are making you uncomfortable: ? Change your position from lying down or resting to walking, or change from walking to resting. ? Sit and rest in a tub of warm water. ? Drink enough fluid to keep your urine pale yellow. Dehydration may cause these contractions. ? Do slow and deep breathing several times an hour.  Keep all follow-up prenatal visits as told by your health care provider. This is important. Contact a health care provider if:  You have a fever.  You have continuous pain in your abdomen. Get help right away if:  Your contractions become stronger, more regular, and closer together.  You have fluid leaking or gushing from your vagina.  You pass blood-tinged mucus (bloody show).  You have bleeding from your vagina.  You have low back pain that you never had before.  You feel your baby's head pushing down and causing pelvic pressure.  Your baby is not moving inside you as much as it used to. Summary  Contractions that occur before labor are   called Braxton Hicks contractions, false labor, or practice contractions.  Braxton Hicks contractions are usually shorter, weaker, farther apart, and less regular than true labor contractions. True labor contractions usually become progressively stronger and regular, and they become more frequent.  Manage discomfort from [facility] contractions  by changing position, resting in a warm bath, drinking plenty of water, or practicing deep breathing. This information is not intended to replace advice given to you by your health care provider. Make sure you discuss any questions you have with your health care provider. Document Revised: 03/06/2017 Document Reviewed: 08/07/2016 Elsevier Patient Education  2020 [employer].    Third Trimester of Pregnancy The third trimester is from week 28 through week 40 (months 7 through 9). The third trimester is a time when the unborn baby (fetus) is growing rapidly. At the end of the ninth month, the fetus is about 20 inches in length and weighs 6-10 pounds. Body changes during your third trimester Your body will continue to go through many changes during pregnancy. The changes vary from woman to woman. During the third trimester:  Your weight will continue to increase. You can expect to gain 25-35 pounds (11-16 kg) by the end of the pregnancy.  You may begin to get stretch marks on your hips, abdomen, and breasts.  You may urinate more often because the fetus is moving lower into your pelvis and pressing on your bladder.  You may develop or continue to have heartburn. This is caused by increased hormones that slow down muscles in the digestive tract.  You may develop or continue to have constipation because increased hormones slow digestion and cause the muscles that push waste through your intestines to relax.  You may develop hemorrhoids. These are swollen veins (varicose veins) in the rectum that can itch or be painful.  You may develop swollen, bulging veins (varicose veins) in your legs.  You may have increased body aches in the pelvis, back, or thighs. This is due to weight gain and increased hormones that are relaxing your joints.  You may have changes in your hair. These can include thickening of your hair, rapid growth, and changes in texture. Some women also have hair loss during or  after pregnancy, or hair that feels dry or thin. Your hair will most likely return to normal after your baby is born.  Your breasts will continue to grow and they will continue to become tender. A yellow fluid (colostrum) may leak from your breasts. This is the first milk you are producing for your baby.  Your belly button may stick out.  You may notice more swelling in your hands, face, or ankles.  You may have increased tingling or numbness in your hands, arms, and legs. The skin on your belly may also feel numb.  You may feel short of breath because of your expanding uterus.  You may have more problems sleeping. This can be caused by the size of your belly, increased need to urinate, and an increase in your body's metabolism.  You may notice the fetus "dropping," or moving lower in your abdomen (lightening).  You may have increased vaginal discharge.  You may notice your joints feel loose and you may have pain around your pelvic bone. What to expect at prenatal visits You will have prenatal exams every 2 weeks until week 36. Then you will have weekly prenatal exams. During a routine prenatal visit:  You will be weighed to make sure you and the baby are  growing normally.  Your blood pressure will be taken.  Your abdomen will be measured to track your baby's growth.  The fetal heartbeat will be listened to.  Any test results from the previous visit will be discussed.  You may have a cervical check near your due date to see if your cervix has softened or thinned (effaced).  You will be tested for Group B streptococcus. This happens between 35 and 37 weeks. Your health care provider may ask you:  What your birth plan is.  How you are feeling.  If you are feeling the baby move.  If you have had any abnormal symptoms, such as leaking fluid, bleeding, severe headaches, or abdominal cramping.  If you are using any tobacco products, including cigarettes, chewing tobacco, and  electronic cigarettes.  If you have any questions. Other tests or screenings that may be performed during your third trimester include:  Blood tests that check for low iron levels (anemia).  Fetal testing to check the health, activity level, and growth of the fetus. Testing is done if you have certain medical conditions or if there are problems during the pregnancy.  Nonstress test (NST). This test checks the health of your baby to make sure there are no signs of problems, such as the baby not getting enough oxygen. During this test, a belt is placed around your belly. The baby is made to move, and its heart rate is monitored during movement. What is false labor? False labor is a condition in which you feel small, irregular tightenings of the muscles in the womb (contractions) that usually go away with rest, changing position, or drinking water. These are called Braxton Hicks contractions. Contractions may last for hours, days, or even weeks before true labor sets in. If contractions come at regular intervals, become more frequent, increase in intensity, or become painful, you should see your health care provider. What are the signs of labor?  Abdominal cramps.  Regular contractions that start at 10 minutes apart and become stronger and more frequent with time.  Contractions that start on the top of the uterus and spread down to the lower abdomen and back.  Increased pelvic pressure and dull back pain.  A watery or bloody mucus discharge that comes from the vagina.  Leaking of amniotic fluid. This is also known as your "water breaking." It could be a slow trickle or a gush. Let your health care provider know if it has a color or strange odor. If you have any of these signs, call your health care provider right away, even if it is before your due date. Follow these instructions at home: Medicines  Follow your health care provider's instructions regarding medicine use. Specific medicines  may be either safe or unsafe to take during pregnancy.  Take a prenatal vitamin that contains at least 600 micrograms (mcg) of folic acid.  If you develop constipation, try taking a stool softener if your health care provider approves. Eating and drinking   Eat a balanced diet that includes fresh fruits and vegetables, whole grains, good sources of protein such as meat, eggs, or tofu, and low-fat dairy. Your health care provider will help you determine the amount of weight gain that is right for you.  Avoid raw meat and uncooked cheese. These carry germs that can cause birth defects in the baby.  If you have low calcium intake from food, talk to your health care provider about whether you should take a daily calcium supplement.  Eat  four or five small meals rather than three large meals a day.  Limit foods that are high in fat and processed sugars, such as fried and sweet foods.  To prevent constipation: ? Drink enough fluid to keep your urine clear or pale yellow. ? Eat foods that are high in fiber, such as fresh fruits and vegetables, whole grains, and beans. Activity  Exercise only as directed by your health care provider. Most women can continue their usual exercise routine during pregnancy. Try to exercise for 30 minutes at least 5 days a week. Stop exercising if you experience uterine contractions.  Avoid heavy lifting.  Do not exercise in extreme heat or humidity, or at high altitudes.  Wear low-heel, comfortable shoes.  Practice good posture.  You may continue to have sex unless your health care provider tells you otherwise. Relieving pain and discomfort  Take frequent breaks and rest with your legs elevated if you have leg cramps or low back pain.  Take warm sitz baths to soothe any pain or discomfort caused by hemorrhoids. Use hemorrhoid cream if your health care provider approves.  Wear a good support bra to prevent discomfort from breast tenderness.  If you  develop varicose veins: ? Wear support pantyhose or compression stockings as told by your healthcare provider. ? Elevate your feet for 15 minutes, 3-4 times a day. Prenatal care  Write down your questions. Take them to your prenatal visits.  Keep all your prenatal visits as told by your health care provider. This is important. Safety  Wear your seat belt at all times when driving.  Make a list of emergency phone numbers, including numbers for family, friends, the hospital, and police and fire departments. General instructions  Avoid cat litter boxes and soil used by cats. These carry germs that can cause birth defects in the baby. If you have a cat, ask someone to clean the litter box for you.  Do not travel far distances unless it is absolutely necessary and only with the approval of your health care provider.  Do not use hot tubs, steam rooms, or saunas.  Do not drink alcohol.  Do not use any products that contain nicotine or tobacco, such as cigarettes and e-cigarettes. If you need help quitting, ask your health care provider.  Do not use any medicinal herbs or unprescribed drugs. These chemicals affect the formation and growth of the baby.  Do not douche or use tampons or scented sanitary pads.  Do not cross your legs for long periods of time.  To prepare for the arrival of your baby: ? Take prenatal classes to understand, practice, and ask questions about labor and delivery. ? Make a trial run to the hospital. ? Visit the hospital and tour the maternity area. ? Arrange for maternity or paternity leave through employers. ? Arrange for family and friends to take care of pets while you are in the hospital. ? Purchase a rear-facing car seat and make sure you know how to install it in your car. ? Pack your hospital bag. ? Prepare the baby's nursery. Make sure to remove all pillows and stuffed animals from the baby's crib to prevent suffocation.  Visit your dentist if you have  not gone during your pregnancy. Use a soft toothbrush to brush your teeth and be gentle when you floss. Contact a health care provider if:  You are unsure if you are in labor or if your water has broken.  You become dizzy.  You have mild  pelvic cramps, pelvic pressure, or nagging pain in your abdominal area.  You have lower back pain.  You have persistent nausea, vomiting, or diarrhea.  You have an unusual or bad smelling vaginal discharge.  You have pain when you urinate. Get help right away if:  Your water breaks before 37 weeks.  You have regular contractions less than 5 minutes apart before 37 weeks.  You have a fever.  You are leaking fluid from your vagina.  You have spotting or bleeding from your vagina.  You have severe abdominal pain or cramping.  You have rapid weight loss or weight gain.  You have shortness of breath with chest pain.  You notice sudden or extreme swelling of your face, hands, ankles, feet, or legs.  Your baby makes fewer than 10 movements in 2 hours.  You have severe headaches that do not go away when you take medicine.  You have vision changes. Summary  The third trimester is from week 28 through week 40, months 7 through 9. The third trimester is a time when the unborn baby (fetus) is growing rapidly.  During the third trimester, your discomfort may increase as you and your baby continue to gain weight. You may have abdominal, leg, and back pain, sleeping problems, and an increased need to urinate.  During the third trimester your breasts will keep growing and they will continue to become tender. A yellow fluid (colostrum) may leak from your breasts. This is the first milk you are producing for your baby.  False labor is a condition in which you feel small, irregular tightenings of the muscles in the womb (contractions) that eventually go away. These are called Braxton Hicks contractions. Contractions may last for hours, days, or even  weeks before true labor sets in.  Signs of labor can include: abdominal cramps; regular contractions that start at 10 minutes apart and become stronger and more frequent with time; watery or bloody mucus discharge that comes from the vagina; increased pelvic pressure and dull back pain; and leaking of amniotic fluid. This information is not intended to replace advice given to you by your health care provider. Make sure you discuss any questions you have with your health care provider. Document Revised: 07/15/2018 Document Reviewed: 04/29/2016 Elsevier Patient Education  2020 [employer].    Breastfeeding  Choosing to breastfeed is one of the best decisions you can make for yourself and your baby. A change in hormones during pregnancy causes your breasts to make breast milk in your milk-producing glands. Hormones prevent breast milk from being released before your baby is born. They also prompt milk flow after birth. Once breastfeeding has begun, thoughts of your baby, as well as his or her sucking or crying, can stimulate the release of milk from your milk-producing glands. Benefits of breastfeeding Research shows that breastfeeding offers many health benefits for infants and mothers. It also offers a cost-free and convenient way to feed your baby. For your baby  Your first milk (colostrum) helps your baby's digestive system to function better.  Special cells in your milk (antibodies) help your baby to fight off infections.  Breastfed babies are less likely to develop asthma, allergies, obesity, or type 2 diabetes. They are also at lower risk for sudden infant death syndrome (SIDS).  Nutrients in breast milk are better able to meet your baby's needs compared to infant formula.  Breast milk improves your baby's brain development. For you  Breastfeeding helps to create a very special bond between  you and your baby.  Breastfeeding is convenient. Breast milk costs nothing and is always  available at the correct temperature.  Breastfeeding helps to burn calories. It helps you to lose the weight that you gained during pregnancy.  Breastfeeding makes your uterus return faster to its size before pregnancy. It also slows bleeding (lochia) after you give birth.  Breastfeeding helps to lower your risk of developing type 2 diabetes, osteoporosis, rheumatoid arthritis, cardiovascular disease, and breast, ovarian, uterine, and endometrial cancer later in life. Breastfeeding basics Starting breastfeeding  Find a comfortable place to sit or lie down, with your neck and back well-supported.  Place a pillow or a rolled-up blanket under your baby to bring him or her to the level of your breast (if you are seated). Nursing pillows are specially designed to help support your arms and your baby while you breastfeed.  Make sure that your baby's tummy (abdomen) is facing your abdomen.  Gently massage your breast. With your fingertips, massage from the outer edges of your breast inward toward the nipple. This encourages milk flow. If your milk flows slowly, you may need to continue this action during the feeding.  Support your breast with 4 fingers underneath and your thumb above your nipple (make the letter "C" with your hand). Make sure your fingers are well away from your nipple and your baby's mouth.  Stroke your baby's lips gently with your finger or nipple.  When your baby's mouth is open wide enough, quickly bring your baby to your breast, placing your entire nipple and as much of the areola as possible into your baby's mouth. The areola is the colored area around your nipple. ? More areola should be visible above your baby's upper lip than below the lower lip. ? Your baby's lips should be opened and extended outward (flanged) to ensure an adequate, comfortable latch. ? Your baby's tongue should be between his or her lower gum and your breast.  Make sure that your baby's mouth is  correctly positioned around your nipple (latched). Your baby's lips should create a seal on your breast and be turned out (everted).  It is common for your baby to suck about 2-3 minutes in order to start the flow of breast milk. Latching Teaching your baby how to latch onto your breast properly is very important. An improper latch can cause nipple pain, decreased milk supply, and poor weight gain in your baby. Also, if your baby is not latched onto your nipple properly, he or she may swallow some air during feeding. This can make your baby fussy. Burping your baby when you switch breasts during the feeding can help to get rid of the air. However, teaching your baby to latch on properly is still the best way to prevent fussiness from swallowing air while breastfeeding. Signs that your baby has successfully latched onto your nipple  Silent tugging or silent sucking, without causing you pain. Infant's lips should be extended outward (flanged).  Swallowing heard between every 3-4 sucks once your milk has started to flow (after your let-down milk reflex occurs).  Muscle movement above and in front of his or her ears while sucking. Signs that your baby has not successfully latched onto your nipple  Sucking sounds or smacking sounds from your baby while breastfeeding.  Nipple pain. If you think your baby has not latched on correctly, slip your finger into the corner of your baby's mouth to break the suction and place it between your baby's gums. Attempt  to start breastfeeding again. Signs of successful breastfeeding Signs from your baby  Your baby will gradually decrease the number of sucks or will completely stop sucking.  Your baby will fall asleep.  Your baby's body will relax.  Your baby will retain a small amount of milk in his or her mouth.  Your baby will let go of your breast by himself or herself. Signs from you  Breasts that have increased in firmness, weight, and size 1-3 hours  after feeding.  Breasts that are softer immediately after breastfeeding.  Increased milk volume, as well as a change in milk consistency and color by the fifth day of breastfeeding.  Nipples that are not sore, cracked, or bleeding. Signs that your baby is getting enough milk  Wetting at least 1-2 diapers during the first 24 hours after birth.  Wetting at least 5-6 diapers every 24 hours for the first week after birth. The urine should be clear or pale yellow by the age of 5 days.  Wetting 6-8 diapers every 24 hours as your baby continues to grow and develop.  At least 3 stools in a 24-hour period by the age of 5 days. The stool should be soft and yellow.  At least 3 stools in a 24-hour period by the age of 7 days. The stool should be seedy and yellow.  No loss of weight greater than 10% of birth weight during the first 3 days of life.  Average weight gain of 4-7 oz (113-198 g) per week after the age of 4 days.  Consistent daily weight gain by the age of 5 days, without weight loss after the age of 2 weeks. After a feeding, your baby may spit up a small amount of milk. This is normal. Breastfeeding frequency and duration Frequent feeding will help you make more milk and can prevent sore nipples and extremely full breasts (breast engorgement). Breastfeed when you feel the need to reduce the fullness of your breasts or when your baby shows signs of hunger. This is called "breastfeeding on demand." Signs that your baby is hungry include:  Increased alertness, activity, or restlessness.  Movement of the head from side to side.  Opening of the mouth when the corner of the mouth or cheek is stroked (rooting).  Increased sucking sounds, smacking lips, cooing, sighing, or squeaking.  Hand-to-mouth movements and sucking on fingers or hands.  Fussing or crying. Avoid introducing a pacifier to your baby in the first 4-6 weeks after your baby is born. After this time, you may choose to use  a pacifier. Research has shown that pacifier use during the first year of a baby's life decreases the risk of sudden infant death syndrome (SIDS). Allow your baby to feed on each breast as long as he or she wants. When your baby unlatches or falls asleep while feeding from the first breast, offer the second breast. Because newborns are often sleepy in the first few weeks of life, you may need to awaken your baby to get him or her to feed. Breastfeeding times will vary from baby to baby. However, the following rules can serve as a guide to help you make sure that your baby is properly fed:  Newborns (babies 874 weeks of age or younger) may breastfeed every 1-3 hours.  Newborns should not go without breastfeeding for longer than 3 hours during the day or 5 hours during the night.  You should breastfeed your baby a minimum of 8 times in a 24-hour period.  Breast milk pumping     Pumping and storing breast milk allows you to make sure that your baby is exclusively fed your breast milk, even at times when you are unable to breastfeed. This is especially important if you go back to work while you are still breastfeeding, or if you are not able to be present during feedings. Your lactation consultant can help you find a method of pumping that works best for you and give you guidelines about how long it is safe to store breast milk. Caring for your breasts while you breastfeed Nipples can become dry, cracked, and sore while breastfeeding. The following recommendations can help keep your breasts moisturized and healthy:  Avoid using soap on your nipples.  Wear a supportive bra designed especially for nursing. Avoid wearing underwire-style bras or extremely tight bras (sports bras).  Air-dry your nipples for 3-4 minutes after each feeding.  Use only cotton bra pads to absorb leaked breast milk. Leaking of breast milk between feedings is normal.  Use lanolin on your nipples after breastfeeding. Lanolin  helps to maintain your skin's normal moisture barrier. Pure lanolin is not harmful (not toxic) to your baby. You may also hand express a few drops of breast milk and gently massage that milk into your nipples and allow the milk to air-dry. In the first few weeks after giving birth, some women experience breast engorgement. Engorgement can make your breasts feel heavy, warm, and tender to the touch. Engorgement peaks within 3-5 days after you give birth. The following recommendations can help to ease engorgement:  Completely empty your breasts while breastfeeding or pumping. You may want to start by applying warm, moist heat (in the shower or with warm, water-soaked hand towels) just before feeding or pumping. This increases circulation and helps the milk flow. If your baby does not completely empty your breasts while breastfeeding, pump any extra milk after he or she is finished.  Apply ice packs to your breasts immediately after breastfeeding or pumping, unless this is too uncomfortable for you. To do this: ? Put ice in a plastic bag. ? Place a towel between your skin and the bag. ? Leave the ice on for 20 minutes, 2-3 times a day.  Make sure that your baby is latched on and positioned properly while breastfeeding. If engorgement persists after 48 hours of following these recommendations, contact your health care provider or a Advertising copywriter. Overall health care recommendations while breastfeeding  Eat 3 healthy meals and 3 snacks every day. Well-nourished mothers who are breastfeeding need an additional 450-500 calories a day. You can meet this requirement by increasing the amount of a balanced diet that you eat.  Drink enough water to keep your urine pale yellow or clear.  Rest often, relax, and continue to take your prenatal vitamins to prevent fatigue, stress, and low vitamin and mineral levels in your body (nutrient deficiencies).  Do not use any products that contain nicotine or  tobacco, such as cigarettes and e-cigarettes. Your baby may be harmed by chemicals from cigarettes that pass into breast milk and exposure to secondhand smoke. If you need help quitting, ask your health care provider.  Avoid alcohol.  Do not use illegal drugs or marijuana.  Talk with your health care provider before taking any medicines. These include over-the-counter and prescription medicines as well as vitamins and herbal supplements. Some medicines that may be harmful to your baby can pass through breast milk.  It is possible to become pregnant while  breastfeeding. If birth control is desired, ask your health care provider about options that will be safe while breastfeeding your baby. Where to find more information: [employer]: www.llli.org Contact a health care provider if:  You feel like you want to stop breastfeeding or have become frustrated with breastfeeding.  Your nipples are cracked or bleeding.  Your breasts are red, tender, or warm.  You have: ? Painful breasts or nipples. ? A swollen area on either breast. ? A fever or chills. ? Nausea or vomiting. ? Drainage other than breast milk from your nipples.  Your breasts do not become full before feedings by the fifth day after you give birth.  You feel sad and depressed.  Your baby is: ? Too sleepy to eat well. ? Having trouble sleeping. ? More than 68 week old and wetting fewer than 6 diapers in a 24-hour period. ? Not gaining weight by 18 days of age.  Your baby has fewer than 3 stools in a 24-hour period.  Your baby's skin or the white parts of his or her eyes become yellow. Get help right away if:  Your baby is overly tired (lethargic) and does not want to wake up and feed.  Your baby develops an unexplained fever. Summary  Breastfeeding offers many health benefits for infant and mothers.  Try to breastfeed your infant when he or she shows early signs of hunger.  Gently tickle or stroke  your baby's lips with your finger or nipple to allow the baby to open his or her mouth. Bring the baby to your breast. Make sure that much of the areola is in your baby's mouth. Offer one side and burp the baby before you offer the other side.  Talk with your health care provider or lactation consultant if you have questions or you face problems as you breastfeed. This information is not intended to replace advice given to you by your health care provider. Make sure you discuss any questions you have with your health care provider. Document Revised: 06/18/2017 Document Reviewed: 04/25/2016 Elsevier Patient Education  2020 [employer].

## 2019-10-11 NOTE — Progress Notes (Signed)
ROB: Notes braxton hicks. Desires to breastfeed/pump. Desires to try not to get an epidural this pregnancy, but is not completely excluding.  Contraception undecided but would like a non-hormonal option (considering between copper IUD and BTL). Given handouts. Medicaid BTL form signed today in case it is desired. RTC in 2 weeks.   The following were addressed during this visit:  Breastfeeding Education - Early initiation of breastfeeding    Comments: Keeps milk supply adequate, helps contract uterus and slow bleeding, and early milk is the perfect first food and is easy to digest.   - The importance of exclusive breastfeeding    Comments: Provides antibodies, Lower risk of breast and ovarian cancers, and type-2 diabetes,Helps your body recover, Reduced chance of SIDS.   - Risks of giving your baby anything other than breast milk if you are breastfeeding    Comments: Make the baby less content with breastfeeds, may make my baby more susceptible to illness, and may reduce my milk supply.   - Nonpharmacological pain relief methods for labor    Comments: Deep breathing, focusing on pleasant things, movement and walking, heating pads or cold compress, massage and relaxation, continuous support from someone you trust, and Doulas   - The importance of early skin-to-skin contact    Comments: Keeps baby warm and secure, helps keep baby's blood sugar up and breathing steady, easier to bond and breastfeed, and helps calm baby.  - Rooming-in on a 24-hour basis    Comments: Easier to learn baby's feeding cues, easier to bond and get to know each other, and encourages milk production.   - Feeding on demand or baby-led feeding    Comments: Helps prevent breastfeeding complications, helps bring in good milk supply, prevents under or overfeeding, and helps baby feel content and satisfied   - Frequent feeding to help assure optimal milk production    Comments: Making a full supply of milk requires  frequent removal of milk from breasts, infant will eat 8-12 times in 24 hours, if separated from infant use breast massage, hand expression and/ or pumping to remove milk from breasts.   - Effective positioning and attachment    Comments: Helps my baby to get enough breast milk, helps to produce an adequate milk supply, and helps prevent nipple pain and damage   - Exclusive breastfeeding for the first 6 months    Comments: Builds a healthy milk supply and keeps it up, protects baby from sickness and disease, and breastmilk has everything your baby needs for the first 6 months.  - Individualized Education    Comments: Contraindications to breastfeeding and other special medical conditions

## 2019-10-21 ENCOUNTER — Ambulatory Visit (INDEPENDENT_AMBULATORY_CARE_PROVIDER_SITE_OTHER): Payer: Medicaid Other

## 2019-10-21 ENCOUNTER — Other Ambulatory Visit: Payer: Self-pay

## 2019-10-21 ENCOUNTER — Encounter: Payer: Self-pay | Admitting: Nurse Practitioner

## 2019-10-21 ENCOUNTER — Ambulatory Visit (INDEPENDENT_AMBULATORY_CARE_PROVIDER_SITE_OTHER): Payer: Medicaid Other | Admitting: Nurse Practitioner

## 2019-10-21 VITALS — BP 120/70 | HR 94 | Ht 64.0 in | Wt 168.1 lb

## 2019-10-21 DIAGNOSIS — R002 Palpitations: Secondary | ICD-10-CM

## 2019-10-21 DIAGNOSIS — R000 Tachycardia, unspecified: Secondary | ICD-10-CM | POA: Diagnosis not present

## 2019-10-21 NOTE — Patient Instructions (Signed)
Medication Instructions:  Your physician recommends that you continue on your current medications as directed. Please refer to the Current Medication list given to you today.  *If you need a refill on your cardiac medications before your next appointment, please call your pharmacy*   Lab Work:none ordered If you have labs (blood work) drawn today and your tests are completely normal, you will receive your results only by: Marland Kitchen MyChart Message (if you have MyChart) OR . A paper copy in the mail If you have any lab test that is abnormal or we need to change your treatment, we will call you to review the results.   Testing/Procedures: [employer] AT: We will place order and you will receive it in the mail.  You may get a call from [employer] company @ either  [phone] Or  [phone] for them to confirm your address before it will be sent to you.  You will wear the monitor for 14 days, remove it and send it back to the company. They will send [place] a report. Then we will call you with the results.   Follow-Up: At [facility], you and your health needs are our priority.  As part of our continuing mission to provide you with exceptional heart care, we have created designated Provider Care Teams.  These Care Teams include your primary Cardiologist (physician) and Advanced Practice Providers (APPs -  Physician Assistants and Nurse Practitioners) who all work together to provide you with the care you need, when you need it.  We recommend signing up for the patient portal called "MyChart".  Sign up information is provided on this After Visit Summary.  MyChart is used to connect with patients for Virtual Visits (Telemedicine).  Patients are able to view lab/test results, encounter notes, upcoming appointments, etc.  Non-urgent messages can be sent to your provider as well.   To learn more about what you can do with MyChart, go to [email or web address].    Your next appointment:   4 week(s)  The format for  your next appointment:   In Person  Provider:    You may see Yvonne Kendall, MD or Nicolasa Ducking, NP

## 2019-10-21 NOTE — Progress Notes (Signed)
Office Visit    Patient Name: Misty Chambers Date of Encounter: 10/21/2019  Primary Care Provider:  Patient, No Pcp Per Primary Cardiologist:  Yvonne Kendall, MD  Chief Complaint    34 year old female without significant prior past medical history who was recently evaluated by Dr. Okey Dupre secondary to bradycardia, dyspnea exertion, and systolic murmur, in the setting of pregnancy.  Past Medical History    Past Medical History:  Diagnosis Date  . Bradycardia   . Dyspnea on exertion    a. in setting of pregnancy 09/2019.  Marland Kitchen No pertinent past medical history   . Systolic murmur    a. 09/2019 Echo: EF 60-65%, no rwma, nl RV size/fxn. Mild MR.   Past Surgical History:  Procedure Laterality Date  . APPENDECTOMY      Allergies  No Known Allergies  History of Present Illness    34 year old female without significant past medical history.  She was recent evaluated in May of this year by Dr. Okey Dupre in the setting of bradycardia and dyspnea on exertion.  She also reported "brain fog" sometimes associated with dizziness, which she was concerned may be related to bradycardia.  Systolic murmur was noted on examination.  Echocardiogram was performed and showed normal LV function with mild mitral regurgitation.  Since her last visit, she has not noticed any significant bradycardia and has actually had less brain fog.  Over the past 2 weeks, she has been experiencing nocturnal tachypalpitations associated with dyspnea.  She describes this as awaking suddenly and noticing a fast and forceful heartbeat.  Symptoms of tachycardia and dyspnea can last up to 90 minutes prior to being able to get comfortable and falling back to sleep.  She has never checked her pulse during these episodes.  She denies chest pain, presyncope, or syncope.  Sometimes, her abdomen feels tight at the onset of these episodes and she is wondering if she is having [employer] contractions.  She has not experienced  tachypalpitations during the day.  Home Medications    Prior to Admission medications   Medication Sig Start Date End Date Taking? Authorizing Provider  Prenatal Vit-Fe Phos-FA-Omega (VITAFOL GUMMIES) 3.33-0.333-34.8 MG CHEW Chew 34.8 mg by mouth daily. Chew 3 gummies once a day. 06/08/19   Hildred Laser, MD    Review of Systems    Nocturnal tachypalpitations with associated dyspnea as outlined above.  She denies chest pain, PND, orthopnea, dizziness, syncope, edema, or early satiety.  All other systems reviewed and are otherwise negative except as noted above.  Physical Exam    VS:  BP 120/70 (BP Location: Left Arm, Patient Position: Sitting, Cuff Size: Normal)   Pulse 94   Ht 5\' 4"  (1.626 m)   Wt 168 lb 2 oz (76.3 kg)   LMP 02/21/2019 (Within Days)   SpO2 99%   BMI 28.86 kg/m  , BMI Body mass index is 28.86 kg/m. GEN: Well nourished, well developed, in no acute distress. HEENT: normal. Neck: Supple, no JVD, carotid bruits, or masses. Cardiac: RRR, I do not appreciate a murmur today.  No rubs, or gallops. No clubbing, cyanosis, edema.  Radials/PT 2+ and equal bilaterally.  Respiratory:  Respirations regular and unlabored, clear to auscultation bilaterally. GI: Soft, nontender, nondistended, BS + x 4. MS: no deformity or atrophy. Skin: warm and dry, no rash. Neuro:  Strength and sensation are intact. Psych: Normal affect.  Accessory Clinical Findings    ECG personally reviewed by me today -regular sinus rhythm, 73- no acute  changes.  Lab Results  Component Value Date   WBC 8.5 09/20/2019   HGB 10.8 (L) 09/20/2019   HCT 30.9 (L) 09/20/2019   MCV 93 09/20/2019   PLT 218 09/20/2019   Lab Results  Component Value Date   CREATININE 0.50 (L) 08/17/2019   BUN 11 08/17/2019   NA 135 08/17/2019   K 4.0 08/17/2019   CL 101 08/17/2019   CO2 20 08/17/2019   Lab Results  Component Value Date   ALT 9 08/17/2019   AST 14 08/17/2019   ALKPHOS 53 08/17/2019   BILITOT <0.2  08/17/2019    Assessment & Plan    1.  Tachypalpitations: Over the past 2 weeks, she has been having almost nightly tachypalpitations associated with dyspnea.  Symptoms can last up to 90 minutes and resolve spontaneously.  She has never checked her pulse during these episodes.  There is no associated chest pain, presyncope, or syncope.  I am concerned that she may be experiencing PSVT and I will place a 14-day ZIO monitor to assess.  Lab evaluation in May showed normal electrolytes and TSH.  2.  History of bradycardia: She has not noticed any slow heartbeats at home and she is normocardic today.  3.  Systolic murmur: I do not appreciate a murmur today.  Previous echo showed mild mitral regurgitation with normal LV function.  4.  Disposition: Follow-up event monitoring as outlined above.  Follow-up w/Dr. End in clinic in 4 weeks or sooner if necessary.  Nicolasa Ducking, NP 10/21/2019, 9:26 AM

## 2019-10-25 ENCOUNTER — Ambulatory Visit (INDEPENDENT_AMBULATORY_CARE_PROVIDER_SITE_OTHER): Payer: Medicaid Other | Admitting: Obstetrics and Gynecology

## 2019-10-25 ENCOUNTER — Encounter: Payer: Self-pay | Admitting: Obstetrics and Gynecology

## 2019-10-25 VITALS — BP 108/70 | HR 63 | Wt 168.2 lb

## 2019-10-25 DIAGNOSIS — Z3A34 34 weeks gestation of pregnancy: Secondary | ICD-10-CM

## 2019-10-25 DIAGNOSIS — Z3483 Encounter for supervision of other normal pregnancy, third trimester: Secondary | ICD-10-CM

## 2019-10-25 LAB — POCT URINALYSIS DIPSTICK OB
Bilirubin, UA: NEGATIVE
Blood, UA: NEGATIVE
Clarity, UA: NEGATIVE
Glucose, UA: NEGATIVE
Ketones, UA: NEGATIVE
Leukocytes, UA: NEGATIVE
Nitrite, UA: NEGATIVE
POC,PROTEIN,UA: NEGATIVE
Spec Grav, UA: 1.015 (ref 1.010–1.025)
Urobilinogen, UA: 0.2 E.U./dL
pH, UA: 6.5 (ref 5.0–8.0)

## 2019-10-25 NOTE — Patient Instructions (Signed)
Signs and Symptoms of Labor Labor is your body's natural process of moving your baby, placenta, and umbilical cord out of your uterus. The process of labor usually starts when your baby is full-term, between 37 and 40 weeks of pregnancy. How will I know when I am close to going into labor? As your body prepares for labor and the birth of your baby, you may notice the following symptoms in the weeks and days before true labor starts:  Having a strong desire to get your home ready to receive your new baby. This is called nesting. Nesting may be a sign that labor is approaching, and it may occur several weeks before birth. Nesting may involve cleaning and organizing your home.  Passing a small amount of thick, bloody mucus out of your vagina (normal bloody show or losing your mucus plug). This may happen more than a week before labor begins, or it might occur right before labor begins as the opening of the cervix starts to widen (dilate). For some women, the entire mucus plug passes at once. For others, smaller portions of the mucus plug may gradually pass over several days.  Your baby moving (dropping) lower in your pelvis to get into position for birth (lightening). When this happens, you may feel more pressure on your bladder and pelvic bone and less pressure on your ribs. This may make it easier to breathe. It may also cause you to need to urinate more often and have problems with bowel movements.  Having "practice contractions" (Braxton Hicks contractions) that occur at irregular (unevenly spaced) intervals that are more than 10 minutes apart. This is also called false labor. False labor contractions are common after exercise or sexual activity, and they will stop if you change position, rest, or drink fluids. These contractions are usually mild and do not get stronger over time. They may feel like: ? A backache or back pain. ? Mild cramps, similar to menstrual cramps. ? Tightening or pressure in  your abdomen. Other early symptoms that labor may be starting soon include:  Nausea or loss of appetite.  Diarrhea.  Having a sudden burst of energy, or feeling very tired.  Mood changes.  Having trouble sleeping. How will I know when labor has begun? Signs that true labor has begun may include:  Having contractions that come at regular (evenly spaced) intervals and increase in intensity. This may feel like more intense tightening or pressure in your abdomen that moves to your back. ? Contractions may also feel like rhythmic pain in your upper thighs or back that comes and goes at regular intervals. ? For first-time mothers, this change in intensity of contractions often occurs at a more gradual pace. ? Women who have given birth before may notice a more rapid progression of contraction changes.  Having a feeling of pressure in the vaginal area.  Your water breaking (rupture of membranes). This is when the sac of fluid that surrounds your baby breaks. When this happens, you will notice fluid leaking from your vagina. This may be clear or blood-tinged. Labor usually starts within 24 hours of your water breaking, but it may take longer to begin. ? Some women notice this as a gush of fluid. ? Others notice that their underwear repeatedly becomes damp. Follow these instructions at home:   When labor starts, or if your water breaks, call your health care provider or nurse care line. Based on your situation, they will determine when you should go in for an   exam.  When you are in early labor, you may be able to rest and manage symptoms at home. Some strategies to try at home include: ? Breathing and relaxation techniques. ? Taking a warm bath or shower. ? Listening to music. ? Using a heating pad on the lower back for pain. If you are directed to use heat:  Place a towel between your skin and the heat source.  Leave the heat on for 20-30 minutes.  Remove the heat if your skin turns  bright red. This is especially important if you are unable to feel pain, heat, or cold. You may have a greater risk of getting burned. Get help right away if:  You have painful, regular contractions that are 5 minutes apart or less.  Labor starts before you are [redacted] weeks along in your pregnancy.  You have a fever.  You have a headache that does not go away.  You have bright red blood coming from your vagina.  You do not feel your baby moving.  You have a sudden onset of: ? Severe headache with vision problems. ? Nausea, vomiting, or diarrhea. ? Chest pain or shortness of breath. These symptoms may be an emergency. If your health care provider recommends that you go to the hospital or birth center where you plan to deliver, do not drive yourself. Have someone else drive you, or call emergency services (911 in the [place] Summary  Labor is your body's natural process of moving your baby, placenta, and umbilical cord out of your uterus.  The process of labor usually starts when your baby is full-term, between 37 and 40 weeks of pregnancy.  When labor starts, or if your water breaks, call your health care provider or nurse care line. Based on your situation, they will determine when you should go in for an exam. This information is not intended to replace advice given to you by your health care provider. Make sure you discuss any questions you have with your health care provider. Document Revised: 12/22/2016 Document Reviewed: 08/29/2016 Elsevier Patient Education  2020 [employer] Braxton Hicks Contractions Contractions of the uterus can occur throughout pregnancy, but they are not always a sign that you are in labor. You may have practice contractions called Braxton Hicks contractions. These false labor contractions are sometimes confused with true labor. What are Braxton Hicks contractions? Braxton Hicks contractions are tightening movements that occur in the muscles of the uterus  before labor. Unlike true labor contractions, these contractions do not result in opening (dilation) and thinning of the cervix. Toward the end of pregnancy (32-34 weeks), Braxton Hicks contractions can happen more often and may become stronger. These contractions are sometimes difficult to tell apart from true labor because they can be very uncomfortable. You should not feel embarrassed if you go to the hospital with false labor. Sometimes, the only way to tell if you are in true labor is for your health care provider to look for changes in the cervix. The health care provider will do a physical exam and may monitor your contractions. If you are not in true labor, the exam should show that your cervix is not dilating and your water has not broken. If there are no other health problems associated with your pregnancy, it is completely safe for you to be sent home with false labor. You may continue to have Braxton Hicks contractions until you go into true labor. How to tell the difference between true labor and false labor True   True labor  Contractions last 30-70 seconds.  Contractions become very regular.  Discomfort is usually felt in the top of the uterus, and it spreads to the lower abdomen and low back.  Contractions do not go away with walking.  Contractions usually become more intense and increase in frequency.  The cervix dilates and gets thinner. False labor  Contractions are usually shorter and not as strong as true labor contractions.  Contractions are usually irregular.  Contractions are often felt in the front of the lower abdomen and in the groin.  Contractions may go away when you walk around or change positions while lying down.  Contractions get weaker and are shorter-lasting as time goes on.  The cervix usually does not dilate or become thin. Follow these instructions at home:   Take over-the-counter and prescription medicines only as told by your health care  provider.  Keep up with your usual exercises and follow other instructions from your health care provider.  Eat and drink lightly if you think you are going into labor.  If Braxton Hicks contractions are making you uncomfortable: ? Change your position from lying down or resting to walking, or change from walking to resting. ? Sit and rest in a tub of warm water. ? Drink enough fluid to keep your urine pale yellow. Dehydration may cause these contractions. ? Do slow and deep breathing several times an hour.  Keep all follow-up prenatal visits as told by your health care provider. This is important. Contact a health care provider if:  You have a fever.  You have continuous pain in your abdomen. Get help right away if:  Your contractions become stronger, more regular, and closer together.  You have fluid leaking or gushing from your vagina.  You pass blood-tinged mucus (bloody show).  You have bleeding from your vagina.  You have low back pain that you never had before.  You feel your baby's head pushing down and causing pelvic pressure.  Your baby is not moving inside you as much as it used to. Summary  Contractions that occur before labor are called Braxton Hicks contractions, false labor, or practice contractions.  Braxton Hicks contractions are usually shorter, weaker, farther apart, and less regular than true labor contractions. True labor contractions usually become progressively stronger and regular, and they become more frequent.  Manage discomfort from [facility] contractions by changing position, resting in a warm bath, drinking plenty of water, or practicing deep breathing. This information is not intended to replace advice given to you by your health care provider. Make sure you discuss any questions you have with your health care provider. Document Revised: 03/06/2017 Document Reviewed: 08/07/2016 Elsevier Patient Education  2020 [employer].

## 2019-10-25 NOTE — Progress Notes (Signed)
ROB: Patient feeling "tired".  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  She was having some issues with low heartbeat and also with waking at night with her heart beating very quickly.  (Possible sleep apnea).  She is currently wearing a Holter monitor but says that she has not had those symptoms since last week.  Reports daily fetal movement.  Cultures next visit.

## 2019-11-07 NOTE — Progress Notes (Signed)
ROB-PT present for routine prenatal care and 36 week cultures. Pt stated that she was doing well no problems.

## 2019-11-08 ENCOUNTER — Ambulatory Visit (INDEPENDENT_AMBULATORY_CARE_PROVIDER_SITE_OTHER): Payer: Medicaid Other | Admitting: Obstetrics and Gynecology

## 2019-11-08 ENCOUNTER — Other Ambulatory Visit: Payer: Self-pay | Admitting: Obstetrics and Gynecology

## 2019-11-08 ENCOUNTER — Encounter: Payer: Self-pay | Admitting: Obstetrics and Gynecology

## 2019-11-08 VITALS — BP 117/76 | HR 80 | Wt 170.3 lb

## 2019-11-08 DIAGNOSIS — O09523 Supervision of elderly multigravida, third trimester: Secondary | ICD-10-CM

## 2019-11-08 DIAGNOSIS — Z3A36 36 weeks gestation of pregnancy: Secondary | ICD-10-CM

## 2019-11-08 DIAGNOSIS — O479 False labor, unspecified: Secondary | ICD-10-CM

## 2019-11-08 DIAGNOSIS — Z3483 Encounter for supervision of other normal pregnancy, third trimester: Secondary | ICD-10-CM

## 2019-11-08 LAB — POCT URINALYSIS DIPSTICK OB
Bilirubin, UA: NEGATIVE
Blood, UA: NEGATIVE
Glucose, UA: NEGATIVE
Ketones, UA: NEGATIVE
Nitrite, UA: NEGATIVE
POC,PROTEIN,UA: NEGATIVE
Spec Grav, UA: <=1.005 — AB (ref 1.010–1.025)
Urobilinogen, UA: 0.2 E.U./dL
pH, UA: 6.5 (ref 5.0–8.0)

## 2019-11-08 NOTE — Progress Notes (Signed)
ROB: Doing well, no complaints.  Is feeling more braxton hicks and back pain. Discussed positions, can use warm baths/compresses.  36 week labs done. RTC in 1 week.

## 2019-11-08 NOTE — Patient Instructions (Addendum)

## 2019-11-10 LAB — GC/CHLAMYDIA PROBE AMP
Chlamydia trachomatis, NAA: NEGATIVE
Neisseria Gonorrhoeae by PCR: NEGATIVE

## 2019-11-10 LAB — STREP GP B NAA: Strep Gp B NAA: POSITIVE — AB

## 2019-11-17 ENCOUNTER — Ambulatory Visit (INDEPENDENT_AMBULATORY_CARE_PROVIDER_SITE_OTHER): Payer: Medicaid Other | Admitting: Obstetrics and Gynecology

## 2019-11-17 ENCOUNTER — Encounter: Payer: Self-pay | Admitting: Obstetrics and Gynecology

## 2019-11-17 ENCOUNTER — Other Ambulatory Visit: Payer: Self-pay

## 2019-11-17 VITALS — BP 124/85 | HR 79 | Wt 171.0 lb

## 2019-11-17 DIAGNOSIS — O09523 Supervision of elderly multigravida, third trimester: Secondary | ICD-10-CM

## 2019-11-17 DIAGNOSIS — Z3A37 37 weeks gestation of pregnancy: Secondary | ICD-10-CM

## 2019-11-17 NOTE — Progress Notes (Signed)
ROB: Denies contractions.  Has no complaints.  Signs and symptoms of labor reviewed.  Positive GBS discussed, antibiotics in labor etc.

## 2019-11-23 ENCOUNTER — Ambulatory Visit (INDEPENDENT_AMBULATORY_CARE_PROVIDER_SITE_OTHER): Payer: Medicaid Other | Admitting: Nurse Practitioner

## 2019-11-23 ENCOUNTER — Encounter: Payer: Self-pay | Admitting: Nurse Practitioner

## 2019-11-23 ENCOUNTER — Other Ambulatory Visit: Payer: Self-pay

## 2019-11-23 VITALS — BP 99/64 | HR 60 | Ht 64.0 in | Wt 172.2 lb

## 2019-11-23 DIAGNOSIS — R002 Palpitations: Secondary | ICD-10-CM | POA: Diagnosis not present

## 2019-11-23 NOTE — Progress Notes (Signed)
Office Visit    Patient Name: Misty Chambers Date of Encounter: 11/23/2019  Primary Care Provider:  Patient, No Pcp Per Primary Cardiologist:  Yvonne Kendall, MD  Chief Complaint    34 year old female without significant prior past medical history who presents for follow-up related to bradycardia, dyspnea on exertion, and systolic murmur, in the setting of pregnancy.  Past Medical History    Past Medical History:  Diagnosis Date  . Bradycardia   . Dyspnea on exertion    a. in setting of pregnancy 09/2019.  Marland Kitchen No pertinent past medical history   . Systolic murmur    a. 09/2019 Echo: EF 60-65%, no rwma, nl RV size/fxn. Mild MR.   Past Surgical History:  Procedure Laterality Date  . APPENDECTOMY      Allergies  No Known Allergies  History of Present Illness    34 year old female without significant past medical history.  In May of this year, she was evaluated in the setting of bradycardia and dyspnea on exertion.  She also reported "brain fog," sometimes associated dizziness, which she was concerned may be related to bradycardia.  Systolic murmur was noted on examination.  Echocardiogram was performed and showed normal LV function with mild mitral vegetation.  At follow-up on July 16, she reported intermittent nocturnal tachypalpitations associated with dyspnea.  Symptoms were lasting up to 90 minutes and resolving spontaneously.  A 14-day ZIO AT was ordered and preliminarily has shown rare PACs and PVCs with a few brief runs of SVT lasting just a couple of beats.  Most triggered events were associated with sinus rhythm or sinus tachycardia.  Since her last visit, she has had a reduction in palpitations.  She has not had any prolonged episodes like what was previously described.  She continues have dyspnea on exertion but thinks this is just related to being pregnant.  She denies any chest pain, dizziness, or "brain fog."  Home Medications    Prior to Admission medications     Medication Sig Start Date End Date Taking? Authorizing Provider  Prenatal Vit-Fe Phos-FA-Omega (VITAFOL GUMMIES) 3.33-0.333-34.8 MG CHEW Chew 34.8 mg by mouth daily. Chew 3 gummies once a day. 06/08/19   Hildred Laser, MD    Review of Systems    Overall stable with fewer palpitations.  Stable dyspnea on exertion.  She denies chest pain, PND, orthopnea, dizziness, syncope, edema, or early satiety.  All other systems reviewed and are otherwise negative except as noted above.  Physical Exam    VS:  BP 99/64 (BP Location: Left Arm, Patient Position: Sitting, Cuff Size: Normal)   Pulse 60   Ht 5\' 4"  (1.626 m)   Wt 172 lb 4 oz (78.1 kg)   LMP 02/21/2019 (Within Days)   SpO2 99%   BMI 29.57 kg/m  , BMI Body mass index is 29.57 kg/m. GEN: Well nourished, well developed, in no acute distress. HEENT: normal. Neck: Supple, no JVD, carotid bruits, or masses. Cardiac: RRR, no murmurs, rubs, or gallops. No clubbing, cyanosis, edema.  Radials/DP/PT 2+ and equal bilaterally.  Respiratory:  Respirations regular and unlabored, clear to auscultation bilaterally. GI: Protuberant and firm in the setting of pregnancy.  Nontender. BS + x 4. MS: no deformity or atrophy. Skin: warm and dry, no rash. Neuro:  Strength and sensation are intact. Psych: Normal affect.  Accessory Clinical Findings    ECG personally reviewed by me today -regular sinus rhythm, 60- no acute changes.  Lab Results  Component Value Date  WBC 8.5 09/20/2019   HGB 10.8 (L) 09/20/2019   HCT 30.9 (L) 09/20/2019   MCV 93 09/20/2019   PLT 218 09/20/2019   Lab Results  Component Value Date   CREATININE 0.50 (L) 08/17/2019   BUN 11 08/17/2019   NA 135 08/17/2019   K 4.0 08/17/2019   CL 101 08/17/2019   CO2 20 08/17/2019   Lab Results  Component Value Date   ALT 9 08/17/2019   AST 14 08/17/2019   ALKPHOS 53 08/17/2019   BILITOT <0.2 08/17/2019    Assessment & Plan    1.  Tachypalpitations: She previously reported  tachypalpitations often occurring at night, lasting up to 90 minutes and subsequently wore a Zio monitor which showed rare, brief episodes of narrow complex tachycardia, which in general were not triggered events.  Most triggered events were associated with either sinus rhythm or sinus tachycardia.  She notes that since wearing the monitor, she has had a significant reduction in palpitations.  No further therapy or monitoring warranted at this time.  2.  History of bradycardia: No significant bradycardia or pauses noted on monitoring.  3.  Systolic murmur: Again, I do not appreciate a murmur.  Previous echo showed mild mitral regurgitation with normal LV function.  4.  Disposition: Patient to deliver in the next few weeks.  Follow-up as needed.   Nicolasa Ducking, NP 11/23/2019, 12:59 PM

## 2019-11-23 NOTE — Progress Notes (Signed)
ROB-Pt present for routine prenatal care. Pt c/o of pain and pressure in the vaginal area no other issues.

## 2019-11-23 NOTE — Patient Instructions (Signed)
Medication Instructions:  °Your physician recommends that you continue on your current medications as directed. Please refer to the Current Medication list given to you today. ° °*If you need a refill on your cardiac medications before your next appointment, please call your pharmacy* ° ° °Lab Work: °None ordered  °If you have labs (blood work) drawn today and your tests are completely normal, you will receive your results only by: °• MyChart Message (if you have MyChart) OR °• A paper copy in the mail °If you have any lab test that is abnormal or we need to change your treatment, we will call you to review the results. ° ° °Testing/Procedures: °None ordered  ° ° °Follow-Up: °At [facility], you and your health needs are our priority.  As part of our continuing mission to provide you with exceptional heart care, we have created designated Provider Care Teams.  These Care Teams include your primary Cardiologist (physician) and Advanced Practice Providers (APPs -  Physician Assistants and Nurse Practitioners) who all work together to provide you with the care you need, when you need it. ° °We recommend signing up for the patient portal called "MyChart".  Sign up information is provided on this After Visit Summary.  MyChart is used to connect with patients for Virtual Visits (Telemedicine).  Patients are able to view lab/test results, encounter notes, upcoming appointments, etc.  Non-urgent messages can be sent to your provider as well.   °To learn more about what you can do with MyChart, go to [email or web address].   ° °Your next appointment:   °As needed °

## 2019-11-24 ENCOUNTER — Ambulatory Visit (INDEPENDENT_AMBULATORY_CARE_PROVIDER_SITE_OTHER): Payer: Medicaid Other | Admitting: Obstetrics and Gynecology

## 2019-11-24 ENCOUNTER — Encounter: Payer: Self-pay | Admitting: Obstetrics and Gynecology

## 2019-11-24 VITALS — BP 106/73 | HR 71 | Wt 173.2 lb

## 2019-11-24 DIAGNOSIS — O09523 Supervision of elderly multigravida, third trimester: Secondary | ICD-10-CM | POA: Insufficient documentation

## 2019-11-24 DIAGNOSIS — Z3A38 38 weeks gestation of pregnancy: Secondary | ICD-10-CM

## 2019-11-24 DIAGNOSIS — Z3483 Encounter for supervision of other normal pregnancy, third trimester: Secondary | ICD-10-CM

## 2019-11-24 NOTE — Patient Instructions (Signed)

## 2019-11-24 NOTE — Progress Notes (Signed)
ROB: [redacted]w[redacted]d, patient is overall doing well. Endorses some vaginal cramping at night when she is trying to go to sleep. Trouble sleeping at night due to discomfort and getting up in the middle of the night to use the bathroom. Patient is not currently working and states she has had time to rest during the day. Understands positive GBS and o concerns or questions at this visit. Discussed signs and symptoms of labor and will follow up in 1 week.

## 2019-11-24 NOTE — Progress Notes (Signed)
ROB: Notes difficulties sleeping at night due to cramping but knows this is normal.  Reiterated labor precautions. RTC in 1 week.

## 2019-12-01 ENCOUNTER — Ambulatory Visit (INDEPENDENT_AMBULATORY_CARE_PROVIDER_SITE_OTHER): Payer: Medicaid Other | Admitting: Obstetrics and Gynecology

## 2019-12-01 ENCOUNTER — Encounter: Payer: Self-pay | Admitting: Obstetrics and Gynecology

## 2019-12-01 ENCOUNTER — Other Ambulatory Visit: Payer: Self-pay

## 2019-12-01 VITALS — BP 122/86 | HR 72 | Wt 173.1 lb

## 2019-12-01 DIAGNOSIS — Z3A39 39 weeks gestation of pregnancy: Secondary | ICD-10-CM

## 2019-12-01 DIAGNOSIS — Z3483 Encounter for supervision of other normal pregnancy, third trimester: Secondary | ICD-10-CM

## 2019-12-01 LAB — POCT URINALYSIS DIPSTICK OB
Bilirubin, UA: NEGATIVE
Blood, UA: NEGATIVE
Glucose, UA: NEGATIVE
Ketones, UA: NEGATIVE
Leukocytes, UA: NEGATIVE
Nitrite, UA: NEGATIVE
POC,PROTEIN,UA: NEGATIVE
Spec Grav, UA: 1.015 (ref 1.010–1.025)
Urobilinogen, UA: 0.2 E.U./dL
pH, UA: 6 (ref 5.0–8.0)

## 2019-12-01 NOTE — Progress Notes (Signed)
ROB: Patient denies true contractions.  Signs and symptoms of labor reviewed.  Induction discussed and scheduled.

## 2019-12-05 ENCOUNTER — Other Ambulatory Visit: Payer: Self-pay

## 2019-12-05 ENCOUNTER — Other Ambulatory Visit
Admission: RE | Admit: 2019-12-05 | Discharge: 2019-12-05 | Disposition: A | Discharge status: Home / Self Care | Payer: Medicaid Other | Source: Ambulatory Visit | Attending: Obstetrics and Gynecology | Admitting: Obstetrics and Gynecology

## 2019-12-05 DIAGNOSIS — Z20822 Contact with and (suspected) exposure to covid-19: Secondary | ICD-10-CM | POA: Insufficient documentation

## 2019-12-05 DIAGNOSIS — Z01812 Encounter for preprocedural laboratory examination: Secondary | ICD-10-CM | POA: Diagnosis present

## 2019-12-05 LAB — SARS CORONAVIRUS 2 (TAT 6-24 HRS): SARS Coronavirus 2: NEGATIVE

## 2019-12-07 ENCOUNTER — Inpatient Hospital Stay
Admission: EM | Admit: 2019-12-07 | Discharge: 2019-12-09 | DRG: 807 | Disposition: A | Discharge status: Home / Self Care | Payer: Medicaid Other | Attending: Obstetrics and Gynecology | Admitting: Obstetrics and Gynecology

## 2019-12-07 ENCOUNTER — Other Ambulatory Visit: Payer: Self-pay

## 2019-12-07 ENCOUNTER — Encounter: Payer: Self-pay | Admitting: Obstetrics and Gynecology

## 2019-12-07 ENCOUNTER — Inpatient Hospital Stay: Payer: Medicaid Other | Admitting: Certified Registered"

## 2019-12-07 DIAGNOSIS — O4292 Full-term premature rupture of membranes, unspecified as to length of time between rupture and onset of labor: Secondary | ICD-10-CM | POA: Diagnosis present

## 2019-12-07 DIAGNOSIS — O4202 Full-term premature rupture of membranes, onset of labor within 24 hours of rupture: Secondary | ICD-10-CM

## 2019-12-07 DIAGNOSIS — O4290 Premature rupture of membranes, unspecified as to length of time between rupture and onset of labor, unspecified weeks of gestation: Secondary | ICD-10-CM

## 2019-12-07 DIAGNOSIS — O99824 Streptococcus B carrier state complicating childbirth: Secondary | ICD-10-CM

## 2019-12-07 DIAGNOSIS — O09523 Supervision of elderly multigravida, third trimester: Secondary | ICD-10-CM | POA: Diagnosis present

## 2019-12-07 DIAGNOSIS — Z3A40 40 weeks gestation of pregnancy: Secondary | ICD-10-CM | POA: Diagnosis not present

## 2019-12-07 DIAGNOSIS — O480 Post-term pregnancy: Secondary | ICD-10-CM

## 2019-12-07 LAB — CBC
HCT: 30.4 % — ABNORMAL LOW (ref 36.0–46.0)
Hemoglobin: 10.4 g/dL — ABNORMAL LOW (ref 12.0–15.0)
MCH: 30 pg (ref 26.0–34.0)
MCHC: 34.2 g/dL (ref 30.0–36.0)
MCV: 87.6 fL (ref 80.0–100.0)
Platelets: 199 10*3/uL (ref 150–400)
RBC: 3.47 MIL/uL — ABNORMAL LOW (ref 3.87–5.11)
RDW: 13 % (ref 11.5–15.5)
WBC: 9.8 10*3/uL (ref 4.0–10.5)
nRBC: 0 % (ref 0.0–0.2)

## 2019-12-07 LAB — TYPE AND SCREEN
ABO/RH(D): O POS
Antibody Screen: NEGATIVE

## 2019-12-07 LAB — ABO/RH: ABO/RH(D): O POS

## 2019-12-07 LAB — RPR: RPR Ser Ql: NONREACTIVE

## 2019-12-07 MED ORDER — OXYTOCIN BOLUS FROM INFUSION: 333.0000 mL | Freq: Once | INTRAVENOUS | Status: DC

## 2019-12-07 MED ORDER — LIDOCAINE-EPINEPHRINE (PF) 1.5 %-1:200000 IJ SOLN
INTRAMUSCULAR | Status: DC | PRN
  Administered 2019-12-07: 5 mL via PERINEURAL

## 2019-12-07 MED ORDER — LACTATED RINGERS IV SOLN: INTRAVENOUS | Status: DC

## 2019-12-07 MED ORDER — EPHEDRINE 5 MG/ML INJ
10.0000 mg | INTRAVENOUS | Status: DC | PRN
  Filled 2019-12-07: qty 2

## 2019-12-07 MED ORDER — SODIUM CHLORIDE 0.9 % IV SOLN
2.0000 g | Freq: Once | INTRAVENOUS | Status: AC
  Administered 2019-12-07: 2 g via INTRAVENOUS
  Filled 2019-12-07: qty 2000

## 2019-12-07 MED ORDER — ZOLPIDEM TARTRATE 5 MG PO TABS: 5.0000 mg | ORAL_TABLET | Freq: Every evening | ORAL | Status: DC | PRN

## 2019-12-07 MED ORDER — DIPHENHYDRAMINE HCL 25 MG PO CAPS: 25.0000 mg | ORAL_CAPSULE | Freq: Four times a day (QID) | ORAL | Status: DC | PRN

## 2019-12-07 MED ORDER — AMMONIA AROMATIC IN INHA
RESPIRATORY_TRACT | Status: AC
  Filled 2019-12-07: qty 10

## 2019-12-07 MED ORDER — DIPHENHYDRAMINE HCL 50 MG/ML IJ SOLN: 12.5000 mg | INTRAMUSCULAR | Status: DC | PRN

## 2019-12-07 MED ORDER — IBUPROFEN 600 MG PO TABS
600.0000 mg | ORAL_TABLET | Freq: Four times a day (QID) | ORAL | Status: DC
  Administered 2019-12-08 – 2019-12-09 (×4): 600 mg via ORAL
  Filled 2019-12-07 (×4): qty 1

## 2019-12-07 MED ORDER — PHENYLEPHRINE 40 MCG/ML (10ML) SYRINGE FOR IV PUSH (FOR BLOOD PRESSURE SUPPORT)
80.0000 ug | PREFILLED_SYRINGE | INTRAVENOUS | Status: DC | PRN
  Filled 2019-12-07: qty 10

## 2019-12-07 MED ORDER — ACETAMINOPHEN 325 MG PO TABS: 650.0000 mg | ORAL_TABLET | ORAL | Status: DC | PRN

## 2019-12-07 MED ORDER — BUPIVACAINE HCL (PF) 0.25 % IJ SOLN
INTRAMUSCULAR | Status: DC | PRN
  Administered 2019-12-07: 4 mL via EPIDURAL
  Administered 2019-12-07: 2 mL via EPIDURAL

## 2019-12-07 MED ORDER — SIMETHICONE 80 MG PO CHEW: 80.0000 mg | CHEWABLE_TABLET | ORAL | Status: DC | PRN

## 2019-12-07 MED ORDER — FENTANYL 2.5 MCG/ML W/ROPIVACAINE 0.15% IN NS 100 ML EPIDURAL (ARMC)
EPIDURAL | Status: AC
  Filled 2019-12-07: qty 100

## 2019-12-07 MED ORDER — ONDANSETRON HCL 4 MG/2ML IJ SOLN: 4.0000 mg | Freq: Four times a day (QID) | INTRAMUSCULAR | Status: DC | PRN

## 2019-12-07 MED ORDER — ONDANSETRON HCL 4 MG PO TABS: 4.0000 mg | ORAL_TABLET | ORAL | Status: DC | PRN

## 2019-12-07 MED ORDER — SOD CITRATE-CITRIC ACID 500-334 MG/5ML PO SOLN: 30.0000 mL | ORAL | Status: DC | PRN

## 2019-12-07 MED ORDER — SENNOSIDES-DOCUSATE SODIUM 8.6-50 MG PO TABS: 2.0000 | ORAL_TABLET | ORAL | Status: DC

## 2019-12-07 MED ORDER — SODIUM CHLORIDE 0.9 % IV SOLN
1.0000 g | INTRAVENOUS | Status: DC
  Administered 2019-12-07 (×3): 1 g via INTRAVENOUS
  Filled 2019-12-07 (×7): qty 1000

## 2019-12-07 MED ORDER — BUTORPHANOL TARTRATE 1 MG/ML IJ SOLN: 1.0000 mg | INTRAMUSCULAR | Status: DC | PRN

## 2019-12-07 MED ORDER — TERBUTALINE SULFATE 1 MG/ML IJ SOLN: 0.2500 mg | Freq: Once | INTRAMUSCULAR | Status: DC | PRN

## 2019-12-07 MED ORDER — BUTORPHANOL TARTRATE 1 MG/ML IJ SOLN
1.0000 mg | INTRAMUSCULAR | Status: DC | PRN
  Administered 2019-12-07: 1 mg via INTRAVENOUS
  Filled 2019-12-07: qty 1

## 2019-12-07 MED ORDER — MISOPROSTOL 200 MCG PO TABS
ORAL_TABLET | ORAL | Status: AC
  Filled 2019-12-07: qty 4

## 2019-12-07 MED ORDER — BENZOCAINE-MENTHOL 20-0.5 % EX AERO: 1.0000 "application " | INHALATION_SPRAY | CUTANEOUS | Status: DC | PRN

## 2019-12-07 MED ORDER — LACTATED RINGERS IV SOLN
500.0000 mL | INTRAVENOUS | Status: DC | PRN
  Administered 2019-12-07: 250 mL via INTRAVENOUS

## 2019-12-07 MED ORDER — LACTATED RINGERS IV SOLN: 500.0000 mL | INTRAVENOUS | Status: DC | PRN

## 2019-12-07 MED ORDER — LACTATED RINGERS IV SOLN
500.0000 mL | Freq: Once | INTRAVENOUS | Status: AC
  Administered 2019-12-07: 500 mL via INTRAVENOUS

## 2019-12-07 MED ORDER — OXYTOCIN BOLUS FROM INFUSION
333.0000 mL | Freq: Once | INTRAVENOUS | Status: AC
  Administered 2019-12-07: 333 mL via INTRAVENOUS

## 2019-12-07 MED ORDER — METOCLOPRAMIDE HCL 10 MG PO TABS: 10.0000 mg | ORAL_TABLET | Freq: Once | ORAL | Status: DC

## 2019-12-07 MED ORDER — OXYTOCIN-SODIUM CHLORIDE 30-0.9 UT/500ML-% IV SOLN
2.5000 [IU]/h | INTRAVENOUS | Status: DC
  Filled 2019-12-07: qty 1000

## 2019-12-07 MED ORDER — OXYTOCIN 10 UNIT/ML IJ SOLN
INTRAMUSCULAR | Status: AC
  Filled 2019-12-07: qty 2

## 2019-12-07 MED ORDER — LIDOCAINE HCL (PF) 1 % IJ SOLN: 30.0000 mL | INTRAMUSCULAR | Status: DC | PRN

## 2019-12-07 MED ORDER — WITCH HAZEL-GLYCERIN EX PADS: 1.0000 "application " | MEDICATED_PAD | CUTANEOUS | Status: DC | PRN

## 2019-12-07 MED ORDER — FENTANYL CITRATE (PF) 100 MCG/2ML IJ SOLN
50.0000 ug | INTRAMUSCULAR | Status: DC | PRN
  Administered 2019-12-07: 50 ug via INTRAVENOUS
  Filled 2019-12-07: qty 2

## 2019-12-07 MED ORDER — OXYTOCIN-SODIUM CHLORIDE 30-0.9 UT/500ML-% IV SOLN
1.0000 m[IU]/min | INTRAVENOUS | Status: DC
  Administered 2019-12-07: 4 m[IU]/min via INTRAVENOUS

## 2019-12-07 MED ORDER — PRENATAL MULTIVITAMIN CH
1.0000 | ORAL_TABLET | Freq: Every day | ORAL | Status: DC
  Administered 2019-12-08: 1 via ORAL
  Filled 2019-12-07: qty 1

## 2019-12-07 MED ORDER — COCONUT OIL OIL: 1.0000 "application " | TOPICAL_OIL | Status: DC | PRN

## 2019-12-07 MED ORDER — FENTANYL 2.5 MCG/ML W/ROPIVACAINE 0.15% IN NS 100 ML EPIDURAL (ARMC)
EPIDURAL | Status: DC | PRN
Start: 2019-12-07 — End: 2019-12-07
  Administered 2019-12-07: 12 mL/h via EPIDURAL

## 2019-12-07 MED ORDER — OXYTOCIN-SODIUM CHLORIDE 30-0.9 UT/500ML-% IV SOLN
2.5000 [IU]/h | INTRAVENOUS | Status: DC
  Administered 2019-12-08: 2.5 [IU]/h via INTRAVENOUS
  Filled 2019-12-07: qty 500

## 2019-12-07 MED ORDER — LIDOCAINE HCL (PF) 1 % IJ SOLN
30.0000 mL | INTRAMUSCULAR | Status: DC | PRN
  Filled 2019-12-07: qty 30

## 2019-12-07 MED ORDER — FENTANYL 2.5 MCG/ML W/ROPIVACAINE 0.15% IN NS 100 ML EPIDURAL (ARMC): 12.0000 mL/h | EPIDURAL | Status: DC

## 2019-12-07 MED ORDER — LIDOCAINE HCL (PF) 1 % IJ SOLN
INTRAMUSCULAR | Status: DC | PRN
  Administered 2019-12-07: 3 mL

## 2019-12-07 MED ORDER — DIBUCAINE (PERIANAL) 1 % EX OINT: 1.0000 "application " | TOPICAL_OINTMENT | CUTANEOUS | Status: DC | PRN

## 2019-12-07 MED ORDER — FAMOTIDINE 20 MG PO TABS: 40.0000 mg | ORAL_TABLET | Freq: Once | ORAL | Status: DC

## 2019-12-07 MED ORDER — MISOPROSTOL 100 MCG PO TABS: 50.0000 ug | ORAL_TABLET | ORAL | Status: DC | PRN

## 2019-12-07 MED ORDER — ONDANSETRON HCL 4 MG/2ML IJ SOLN: 4.0000 mg | INTRAMUSCULAR | Status: DC | PRN

## 2019-12-07 NOTE — OB Triage Note (Signed)
Pt [redacted]w[redacted]d presents to birthplace through ED w/ c/o LOF, states she felt a pop around 2AM and then steady leaking of clear watery fluid. Reports positive fetal movement and no vag bleeding. VSS. Monitors applied and accessing.

## 2019-12-07 NOTE — Anesthesia Preprocedure Evaluation (Signed)
Anesthesia Evaluation  Patient identified by MRN, date of birth, ID band Patient awake    Reviewed: Allergy & Precautions, H&P , NPO status , Patient's Chart, lab work & pertinent test results  Airway Mallampati: II   Neck ROM: full    Dental no notable dental hx. (+) Dental Advidsory Given   Pulmonary neg pulmonary ROS,           Cardiovascular + Valvular Problems/Murmurs      Neuro/Psych negative neurological ROS  negative psych ROS   GI/Hepatic Neg liver ROS, GERD  ,  Endo/Other  negative endocrine ROS  Renal/GU negative Renal ROS  negative genitourinary   Musculoskeletal   Abdominal   Peds  Hematology negative hematology ROS (+)   Anesthesia Other Findings   Reproductive/Obstetrics (+) Pregnancy                             Anesthesia Physical Anesthesia Plan  ASA: II  Anesthesia Plan: Epidural   Post-op Pain Management:    Induction:   PONV Risk Score and Plan:   Airway Management Planned:   Additional Equipment:   Intra-op Plan:   Post-operative Plan:   Informed Consent: I have reviewed the patients History and Physical, chart, labs and discussed the procedure including the risks, benefits and alternatives for the proposed anesthesia with the patient or authorized representative who has indicated his/her understanding and acceptance.     Dental Advisory Given  Plan Discussed with: Anesthesiologist and CRNA  Anesthesia Plan Comments:         Anesthesia Quick Evaluation

## 2019-12-07 NOTE — Progress Notes (Signed)
Intrapartum Progress Note  S: Patient feeling better.  Recently s/p epidural placement.   O: Blood pressure 120/62, pulse (!) 102, temperature 98.2 F (36.8 C), temperature source Oral, resp. rate 18, height 5\' 4"  (1.626 m), weight 78.5 kg, last menstrual period 02/21/2019, SpO2 100 %. Gen App: NAD, moderately uncomfortable with contractions Abdomen: soft, gravid FHT: baseline 125 bpm.  Accels present.  Decels present - intermittent late deceleration from baseline to 100s. moderate in degree variability.   Tocometer: contractions q 2-3 minutes Cervix: 4.8/80/0. Extremities: Nontender, no edema.  Pitocin: 6 mIU  Labs: No new labs   Assessment:  1: SIUP at [redacted]w[redacted]d 2. GBS positive 3. SROM  Plan:  1. Continue IOL with Pitocin 2. Continue Ampicillin for antibiotic prophylaxis.    [redacted]w[redacted]d, MD 12/07/2019 5:48 PM

## 2019-12-07 NOTE — Progress Notes (Signed)
Intrapartum Progress Note  S: Patient feeling pain with contractions. Notes she did not like the Stadol as it made her head feel "foggy".   O: Blood pressure 126/85, pulse (!) 58, temperature (!) 97.5 F (36.4 C), temperature source Oral, resp. rate 18, height 5\' 4"  (1.626 m), weight 78.5 kg, last menstrual period 02/21/2019. Gen App: NAD, moderately uncomfortable with contractions Abdomen: soft, gravid FHT: baseline 125 bpm.  Accels present.  Decels absent. moderate in degree variability.   Tocometer: contractions q 2-4 minutes Cervix: 2.5-3/50/-2 (per nurse S. Apel).  SROM'd since admission Extremities: Nontender, no edema.  Pitocin: 6 mIU  Labs: No new labs   Assessment:  1: SIUP at [redacted]w[redacted]d 2. GBS positive 3. SROM  Plan:  1. Continue IOL with Pitocin 2. Continue Ampicillin for antibiotic prophylaxis.    [redacted]w[redacted]d, MD 12/07/2019 1:20 PM

## 2019-12-07 NOTE — H&P (Signed)
History and Physical   HPI  Quincee Gittens is a 34 y.o. [id number] at [redacted]w[redacted]d Estimated Date of Delivery: 12/02/19 who is being admitted for PROM at term.   OB History  OB History  Gravida Para Term Preterm AB Living  4 1 1  0 2 1  SAB TAB Ectopic Multiple Live Births  0 0 0 0 1    # Outcome Date GA Lbr Len/2nd Weight Sex Delivery Anes PTL Lv  4 Current           3 AB 2019          2 AB 2016          1 Term 05/30/09 [redacted]w[redacted]d  3487 g F Vag-Spont  N LIV    PROBLEM LIST  Pregnancy complications or risks: Patient Active Problem List   Diagnosis Date Noted  . Post term pregnancy 12/07/2019  . Advanced maternal age in multigravida, third trimester 11/24/2019  . Bradycardia 08/17/2019  . Dyspnea on exertion 08/17/2019  . Heart murmur 08/17/2019  . High risk pregnancy, multigravida of advanced maternal age in second trimester 06/21/2019  . Poor weight gain of pregnancy, second trimester 06/21/2019  . History of sciatica 06/21/2019    Prenatal labs and studies: ABO, Rh: --/--/O POS (09/01 09-03-1972) Antibody: NEG (09/01 0609) Rubella: 1.14 (01/21 1004) RPR: Non Reactive (06/15 0829)  HBsAg: Negative (01/21 1004)  HIV: Non Reactive (01/21 1004)  11-06-1998-- (08/03 1500)   Past Medical History:  Diagnosis Date  . Bradycardia   . Dyspnea on exertion    a. in setting of pregnancy 09/2019.  10/2019 No pertinent past medical history   . Systolic murmur    a. 09/2019 Echo: EF 60-65%, no rwma, nl RV size/fxn. Mild MR.     Past Surgical History:  Procedure Laterality Date  . APPENDECTOMY       Medications    Current Discharge Medication List    CONTINUE these medications which have NOT CHANGED   Details  Prenatal Vit-Fe Phos-FA-Omega (VITAFOL GUMMIES) 3.33-0.333-34.8 MG CHEW Chew 34.8 mg by mouth daily. Chew 3 gummies once a day. Qty: 90 tablet, Refills: 11         Allergies  Patient has no known allergies.  Review of Systems  Pertinent items noted in HPI  and remainder of comprehensive ROS otherwise negative.  Physical Exam  BP 126/80 (BP Location: Left Arm)   Pulse (!) 51   Temp 98.3 F (36.8 C) (Oral)   Resp 18   Ht 5\' 4"  (1.626 m)   Wt 78.5 kg   LMP 02/21/2019 (Within Days)   BMI 29.70 kg/m   Lungs:  CTA B Cardio: RRR without M/R/G Abd: Soft, gravid, NT Presentation: cephalic EXT: No C/C/ 1+ Edema DTRs: 2+ B CERVIX: Dilation: 2 Effacement (%): 50 Cervical Position: Posterior Station: -2 Presentation: Vertex Exam by:: Dr 02/23/2019  See Prenatal records for more detailed PE.     FHR:  Variability: Good {> 6 bpm)  Toco: Uterine Contractions: Very irregular contractions q 3-7 min.  Test Results  Results for orders placed or performed during the hospital encounter of 12/07/19 (from the past 24 hour(s))  CBC     Status: Abnormal   Collection Time: 12/07/19  6:05 AM  Result Value Ref Range   WBC 9.8 4.0 - 10.5 K/uL   RBC 3.47 (L) 3.87 - 5.11 MIL/uL   Hemoglobin 10.4 (L) 12.0 - 15.0 g/dL   HCT 02/06/20 (L) 36 - 46 %  MCV 87.6 80.0 - 100.0 fL   MCH 30.0 26.0 - 34.0 pg   MCHC 34.2 30.0 - 36.0 g/dL   RDW 16.1 09.6 - 04.5 %   Platelets 199 150 - 400 K/uL   nRBC 0.0 0.0 - 0.2 %  ABO/Rh     Status: None   Collection Time: 12/07/19  6:05 AM  Result Value Ref Range   ABO/RH(D)      O POS Performed at [facility], [address] [place], [place] [address]   Type and screen [facility]     Status: None   Collection Time: 12/07/19  6:09 AM  Result Value Ref Range   ABO/RH(D) O POS    Antibody Screen NEG    Sample Expiration      12/10/2019,2359 Performed at [facility], [address] [place], [place] [address]    Group B Strep positive  Assessment   [id number] at [redacted]w[redacted]d Estimated Date of Delivery: 12/02/19  The fetus is reassuring.   Patient Active Problem List   Diagnosis Date Noted  . Post term pregnancy 12/07/2019  . Advanced maternal age in multigravida, third  trimester 11/24/2019  . Bradycardia 08/17/2019  . Dyspnea on exertion 08/17/2019  . Heart murmur 08/17/2019  . High risk pregnancy, multigravida of advanced maternal age in second trimester 06/21/2019  . Poor weight gain of pregnancy, second trimester 06/21/2019  . History of sciatica 06/21/2019    Plan  1. Admit to L&D :   2. EFM: -- Category 1 3. Stadol or Epidural if desired.   4. Admission labs  5. Will start pitocin augmentation. 6.  GBS abx  Elonda Husky, M.D. 12/07/2019 7:40 AM

## 2019-12-07 NOTE — Anesthesia Procedure Notes (Signed)
Epidural Patient location during procedure: OB Start time: 12/07/2019 4:26 PM End time: 12/07/2019 4:42 PM  Staffing Anesthesiologist: Yves Dill, MD Resident/CRNA: Elmarie Mainland, CRNA Performed: resident/CRNA   Preanesthetic Checklist Completed: patient identified, IV checked, site marked, risks and benefits discussed, surgical consent, monitors and equipment checked, pre-op evaluation and timeout performed  Epidural Patient position: sitting Prep: ChloraPrep Patient monitoring: heart rate, continuous pulse ox and blood pressure Approach: midline Location: L3-L4 Injection technique: LOR saline  Needle:  Needle type: Tuohy  Needle gauge: 17 G Needle length: 9 cm and 9 Needle insertion depth: 5.5 cm Catheter type: closed end flexible Catheter size: 19 Gauge Catheter at skin depth: 11 cm Test dose: negative and 1.5% lidocaine with Epi 1:200 K  Assessment Events: blood not aspirated, injection not painful, no injection resistance, no paresthesia and negative IV test  Additional Notes 1 attempt Pt. Evaluated and documentation done after procedure finished. Patient identified. Risks/Benefits/Options discussed with patient including but not limited to bleeding, infection, nerve damage, paralysis, failed block, incomplete pain control, headache, blood pressure changes, nausea, vomiting, reactions to medication both or allergic, itching and postpartum back pain. Confirmed with bedside nurse the patient's most recent platelet count. Confirmed with patient that they are not currently taking any anticoagulation, have any bleeding history or any family history of bleeding disorders. Patient expressed understanding and wished to proceed. All questions were answered. Sterile technique was used throughout the entire procedure. Please see nursing notes for vital signs. Test dose was given through epidural catheter and negative prior to continuing to dose epidural or start infusion. Warning  signs of high block given to the patient including shortness of breath, tingling/numbness in hands, complete motor block, or any concerning symptoms with instructions to call for help. Patient was given instructions on fall risk and not to get out of bed. All questions and concerns addressed with instructions to call with any issues or inadequate analgesia.   Patient tolerated the insertion well without immediate complications.Reason for block:procedure for pain

## 2019-12-08 ENCOUNTER — Encounter: Payer: Self-pay | Admitting: Anesthesiology

## 2019-12-08 ENCOUNTER — Encounter: Payer: Self-pay | Admitting: Obstetrics and Gynecology

## 2019-12-08 ENCOUNTER — Encounter
Admission: EM | Disposition: A | Discharge status: Home / Self Care | Payer: Self-pay | Source: Home / Self Care | Attending: Obstetrics and Gynecology

## 2019-12-08 LAB — CBC
HCT: 31.4 % — ABNORMAL LOW (ref 36.0–46.0)
Hemoglobin: 11.3 g/dL — ABNORMAL LOW (ref 12.0–15.0)
MCH: 30.3 pg (ref 26.0–34.0)
MCHC: 36 g/dL (ref 30.0–36.0)
MCV: 84.2 fL (ref 80.0–100.0)
Platelets: 212 10*3/uL (ref 150–400)
RBC: 3.73 MIL/uL — ABNORMAL LOW (ref 3.87–5.11)
RDW: 13.1 % (ref 11.5–15.5)
WBC: 17.2 10*3/uL — ABNORMAL HIGH (ref 4.0–10.5)
nRBC: 0 % (ref 0.0–0.2)

## 2019-12-08 SURGERY — LIGATION, FALLOPIAN TUBE, POSTPARTUM
Anesthesia: Choice

## 2019-12-08 MED ORDER — SENNOSIDES-DOCUSATE SODIUM 8.6-50 MG PO TABS
2.0000 | ORAL_TABLET | ORAL | Status: DC
  Administered 2019-12-08: 2 via ORAL
  Filled 2019-12-08: qty 2

## 2019-12-08 SURGICAL SUPPLY — 25 items
BLADE SURG SZ11 CARB STEEL (BLADE) ×3 IMPLANT
CHLORAPREP W/TINT 26 (MISCELLANEOUS) ×3 IMPLANT
COVER WAND RF STERILE (DRAPES) IMPLANT
DERMABOND ADVANCED (GAUZE/BANDAGES/DRESSINGS) ×2
DERMABOND ADVANCED .7 DNX12 (GAUZE/BANDAGES/DRESSINGS) ×1 IMPLANT
DRAPE LAPAROTOMY 100X77 ABD (DRAPES) ×3 IMPLANT
GLOVE BIO SURGEON STRL SZ 6.5 (GLOVE) ×2 IMPLANT
GLOVE BIO SURGEONS STRL SZ 6.5 (GLOVE) ×1
GLOVE INDICATOR 7.0 STRL GRN (GLOVE) ×3 IMPLANT
GOWN STRL REUS W/ TWL LRG LVL3 (GOWN DISPOSABLE) ×2 IMPLANT
GOWN STRL REUS W/TWL LRG LVL3 (GOWN DISPOSABLE) ×4
KIT TURNOVER CYSTO (KITS) ×3 IMPLANT
NEEDLE HYPO 25GX1X1/2 BEV (NEEDLE) ×3 IMPLANT
NS IRRIG 500ML POUR BTL (IV SOLUTION) ×3 IMPLANT
PACK BASIN MINOR (MISCELLANEOUS) ×3 IMPLANT
SUT MNCRL 4-0 (SUTURE) ×2
SUT MNCRL 4-0 27XMFL (SUTURE) ×1
SUT PLAIN GUT 0 (SUTURE) ×6 IMPLANT
SUT VIC AB 0 CT1 36 (SUTURE) IMPLANT
SUT VIC AB 0 SH 27 (SUTURE) IMPLANT
SUT VIC AB 3-0 SH 27 (SUTURE) ×2
SUT VIC AB 3-0 SH 27X BRD (SUTURE) ×1 IMPLANT
SUT VICRYL 0 AB UR-6 (SUTURE) ×6 IMPLANT
SUTURE MNCRL 4-0 27XMF (SUTURE) ×1 IMPLANT
SYR 10ML LL (SYRINGE) ×3 IMPLANT

## 2019-12-08 NOTE — Anesthesia Postprocedure Evaluation (Signed)
Anesthesia Post Note  Patient: [name]  Procedure(s) Performed: AN AD HOC LABOR EPIDURAL  Patient location during evaluation: Mother Baby Anesthesia Type: Epidural Level of consciousness: oriented and awake and alert Pain management: pain level controlled Vital Signs Assessment: post-procedure vital signs reviewed and stable Respiratory status: spontaneous breathing and respiratory function stable Cardiovascular status: blood pressure returned to baseline and stable Postop Assessment: no headache, no backache, no apparent nausea or vomiting and able to ambulate Anesthetic complications: no   No complications documented.   Last Vitals:  Vitals:   12/08/19 0033 12/08/19 0300  BP: 127/85 133/80  Pulse: 69 62  Resp: 20 18  Temp: 36.9 C 36.6 C  SpO2: 98% 98%    Last Pain:  Vitals:   12/08/19 0500  TempSrc:   PainSc: 0-No pain                 Misty Chambers

## 2019-12-08 NOTE — Lactation Note (Signed)
This note was copied from a baby's chart. Lactation Consultation Note  Patient Name: Misty Chambers [id number] Date: 12/08/2019 Reason for consult: Follow-up assessment;Mother's request;1st time breastfeeding;Term  Lactation consult. Mom's second child, first time breastfeeding. She reports early feeding with Opal felt fine, initial discomfort.  Mom called out with baby Opals cuing. Mom had baby in cradle position with baby latched, initial discomfort noted. LC adjusted position of baby's hips and legs to be more turned in, and pulled down on bottom lip to flange out; mom reports discomfort subsiding.  Reviewed position and latch, alignment, and deep latch for optimal milk removal. Tips given to keep baby awake and alert throughout the feeding, and encouraged breast massage and compression to aid in colostrum transfer. Reviewed BF basics, newborn stomach size, and timing of cluster feeding/growth spurts (potentially overnight tonight), and signs that baby is getting enough based on body language, contentment between feedings and output of void and stools. Mom has her own personal use DEBP that she had questions re: use so that she may bottle feed during the day and put baby to breast in morning and evening. She would like for dad and sister to be involved in baby's care. Tips given to family on how to be involved in feedings and care of infant that could still support the BF journey in the early weeks. Encouraged to allow baby to help establish breastfeeding as much as possible, especially in the first week. Praised parents for dedication in providing breastmilk to their baby, and encouraged to continue to call/seek [facility] support as needed. Parents verbalize understanding.  Maternal Data Formula Feeding for Exclusion: No Has patient been taught Hand Expression?: Yes Does the patient have breastfeeding experience prior to this delivery?: No  Feeding Feeding Type: Breast Fed  LATCH Score Latch:  Grasps breast easily, tongue down, lips flanged, rhythmical sucking.  Audible Swallowing: A few with stimulation  Type of Nipple: Everted at rest and after stimulation  Comfort (Breast/Nipple): Soft / non-tender  Hold (Positioning): Assistance needed to correctly position infant at breast and maintain latch.  LATCH Score: 8  Interventions Interventions: Breast feeding basics reviewed;Adjust position;Support pillows  Lactation Tools Discussed/Used     Consult Status Consult Status: PRN    Danford Bad 12/08/2019, 3:21 PM

## 2019-12-08 NOTE — Progress Notes (Signed)
Post Partum Day # 1, s/p SVD  Subjective: no complaints, up ad lib, voiding and tolerating PO  Objective: Temp:  [97.5 F (36.4 C)-98.7 F (37.1 C)] 97.8 F (36.6 C) (09/02 0300) Pulse Rate:  [49-102] 62 (09/02 0300) Resp:  [18-20] 18 (09/02 0300) BP: (105-155)/(58-93) 133/80 (09/02 0300) SpO2:  [98 %-100 %] 98 % (09/02 0300)  Physical Exam:  General: alert and no distress  Lungs: clear to auscultation bilaterally Breasts: normal appearance, no masses or tenderness Heart: regular rate and rhythm, S1, S2 normal, no murmur, click, rub or gallop Abdomen: soft, non-tender; bowel sounds normal; no masses,  no organomegaly Pelvis: Lochia: appropriate, Uterine Fundus: firm Extremities: DVT Evaluation: No evidence of DVT seen on physical exam. Negative Homan's sign. No cords or calf tenderness. No significant calf/ankle edema.  Recent Labs    12/07/19 0605 12/08/19 0528  HGB 10.4* 11.3*  HCT 30.4* 31.4*    Assessment/Plan: Doing well postpartum  Breastfeeding, Lactation consult as needed. Circumcision prior to discharge  Contraception desires interval tubal ligation    LOS: 1 day   Hildred Laser, MD [facility] 12/08/2019 9:49 AM

## 2019-12-09 DIAGNOSIS — O4290 Premature rupture of membranes, unspecified as to length of time between rupture and onset of labor, unspecified weeks of gestation: Secondary | ICD-10-CM

## 2019-12-09 MED ORDER — IBUPROFEN 600 MG PO TABS: 600.0000 mg | ORAL_TABLET | Freq: Four times a day (QID) | ORAL | 0 refills | Status: AC

## 2019-12-09 NOTE — Progress Notes (Signed)
Patient discharged with infant. Discharge instructions, prescriptions, and follow up appointments given to and reviewed with patient. Patient verbalized understanding. Will be escorted out by axillary.  °

## 2019-12-09 NOTE — Discharge Summary (Signed)
Postpartum Discharge Summary      Patient Name: Misty Chambers DOB: 06-23-1985 MRN: [id number]  Date of admission: 12/07/2019 Delivery date:12/07/2019  Delivering provider: Rubie Maid  Date of discharge: 12/09/2019  Admitting diagnosis: Post term pregnancy [O48.0] Intrauterine pregnancy: [redacted]w[redacted]d     Secondary diagnosis:  Principal Problem:   PROM (premature rupture of membranes) Active Problems:   Advanced maternal age in multigravida, third trimester   Post term pregnancy  Additional problems: None    Discharge diagnosis: Term Pregnancy Delivered                                              Post partum procedures:None Augmentation: Pitocin Complications: None  Hospital course: Induction of Labor With Vaginal Delivery   34 y.o. yo [id number] at [redacted]w[redacted]d was admitted to the hospital 12/07/2019 for induction of labor.  Indication for induction: Postdates and PROM.  Patient had an uncomplicated labor course as follows: Membrane Rupture Time/Date: 2:00 AM ,12/07/2019   Delivery Method:Vaginal, Spontaneous  Episiotomy: None  Lacerations:    Details of delivery can be found in separate delivery note.  Patient had a routine postpartum course. Patient is discharged home 12/09/19.  Newborn Data: Birth date:12/07/2019  Birth time:8:59 PM  Gender:Female  Living status:Living  Apgars:8 ,9  Weight:3710 g   Magnesium Sulfate received: No BMZ received: No Rhophylac:No MMR:No T-DaP:Given prenatally Flu: No Transfusion:No  Physical exam  Vitals:   12/08/19 1623 12/08/19 1932 12/08/19 2321 12/09/19 0804  BP: 112/80 118/83 109/62 101/62  Pulse: (!) 54 60 64 (!) 49  Resp: $Remo'16 18 18 18  'LheyO$ Temp: 98.2 F (36.8 C) 98 F (36.7 C) 98.3 F (36.8 C) 98 F (36.7 C)  TempSrc: Oral Oral Oral Oral  SpO2: 98% 99% 97% 99%  Weight:      Height:       General: alert, cooperative and no distress Lochia: appropriate Uterine Fundus: firm Incision: N/A DVT Evaluation: No evidence of DVT seen on  physical exam. Negative Homan's sign. No cords or calf tenderness. No significant calf/ankle edema.   Labs: Lab Results  Component Value Date   WBC 17.2 (H) 12/08/2019   HGB 11.3 (L) 12/08/2019   HCT 31.4 (L) 12/08/2019   MCV 84.2 12/08/2019   PLT 212 12/08/2019    Edinburgh Score: Edinburgh Postnatal Depression Scale Screening Tool 12/08/2019  I have been able to laugh and see the funny side of things. 0  I have looked forward with enjoyment to things. 0  I have blamed myself unnecessarily when things went wrong. 0  I have been anxious or worried for no good reason. 0  I have felt scared or panicky for no good reason. 0  Things have been getting on top of me. 0  I have been so unhappy that I have had difficulty sleeping. 0  I have felt sad or miserable. 0  I have been so unhappy that I have been crying. 0  The thought of harming myself has occurred to me. 0  Edinburgh Postnatal Depression Scale Total 0     After visit meds:  Allergies as of 12/09/2019   No Known Allergies     Medication List    TAKE these medications   ibuprofen 600 MG tablet Commonly known as: ADVIL Take 1 tablet (600 mg total) by mouth every 6 (six) hours.  Vitafol Gummies 3.33-0.333-34.8 MG Chew Chew 34.8 mg by mouth daily. Chew 3 gummies once a day.        Discharge home in stable condition Infant Feeding: Breast Infant Disposition:home with mother Discharge instruction: per After Visit Summary and Postpartum booklet. Activity: Advance as tolerated. Pelvic rest for 6 weeks.  Diet: routine diet Anticipated Birth Control: Plans Interval BTL Postpartum Appointment:6 weeks Additional Postpartum F/U: 4 week pre-operative visit Future Appointments:No future appointments. Follow up Visit:  Follow-up Information    Rubie Maid, MD Follow up.   Specialties: Obstetrics and Gynecology, Radiology Why: 4 weeks for pre-op visit 6 weeks postpartum visit.  Contact information: [address] [place] [address] [phone]                   12/09/2019 Rubie Maid, MD [facility]

## 2019-12-26 ENCOUNTER — Telehealth: Payer: Self-pay | Admitting: Obstetrics and Gynecology

## 2019-12-26 NOTE — Telephone Encounter (Signed)
Called pt couldn't leave message pt needs 6 week ppv. With cherry

## 2020-01-04 NOTE — Progress Notes (Signed)
   PT is present today for her postpartum visit. Pt stated that she is breastfeeding and have not had sexually intercourse recently. Pt had tubal ligation for birth control . EPDS=01.  Pt stated that she is doing well no complaints.

## 2020-01-06 ENCOUNTER — Other Ambulatory Visit
Admission: RE | Admit: 2020-01-06 | Discharge: 2020-01-06 | Disposition: A | Discharge status: Home / Self Care | Payer: Medicaid Other | Source: Ambulatory Visit | Attending: Obstetrics and Gynecology | Admitting: Obstetrics and Gynecology

## 2020-01-06 ENCOUNTER — Other Ambulatory Visit: Payer: Self-pay

## 2020-01-06 ENCOUNTER — Encounter: Payer: Self-pay | Admitting: Obstetrics and Gynecology

## 2020-01-06 ENCOUNTER — Ambulatory Visit (INDEPENDENT_AMBULATORY_CARE_PROVIDER_SITE_OTHER): Payer: Medicaid Other | Admitting: Obstetrics and Gynecology

## 2020-01-06 DIAGNOSIS — Z01818 Encounter for other preprocedural examination: Secondary | ICD-10-CM

## 2020-01-06 DIAGNOSIS — Z20822 Contact with and (suspected) exposure to covid-19: Secondary | ICD-10-CM | POA: Insufficient documentation

## 2020-01-06 DIAGNOSIS — Z01812 Encounter for preprocedural laboratory examination: Secondary | ICD-10-CM | POA: Insufficient documentation

## 2020-01-06 NOTE — Patient Instructions (Signed)
COVID 19 Instructions for Scheduled Procedure (Inductions/C-sections and GYN surgeries)   Thank you for choosing [facility] for your services.  You have been scheduled for a procedure called ______Laparoscopic Tubal Ligation______________.    Your procedure is scheduled on ________10/4/2021_________________.  You are required to have COVID-19 testing performed 2 days prior to your scheduled procedure date.  Testing is performed between 9 AM and 1 PM Monday through Friday.  Please present for testing on _____10/1/2021_______________ during this hour. Drive up testing is performed in front of the [facility] (this is next to the [facility]).    Upon your scheduled procedure date, you will need to arrive at the [facility] entrance. (There is a statue at the front of this entrance.)   Please arrive on time if you are scheduled for an induction of labor.   If you are scheduled for a Cesarean delivery or for Gyn Surgery, arrive 2 hours prior to your procedure time.   If you are an Obstetric patient and your arrival time falls between 11 PM and 6 AM call L&D [phone]) when you arrive.  A staff member will meet you at the [facility] entrance.  At this time, patients are allowed 1 support person to accompany them. Face masks are required for you and your support person. Your support person is now allowed to be there with you during the entire time of your admission.   Please contact the office if you have any questions regarding this information.  The [facility] office number is [phone].     Thank you,    Your [facility] Providers      You are scheduled for surgery on 01/09/2020.  Nothing to eat after midnight on day prior to surgery.  Do not take any medications unless recommended by your provider on day prior to surgery.  You will receive a prescription for pain medications post-operatively.  You will be contacted by phone prior to surgery to  receive pre-op instructions.  Please call the office if you have any questions regarding your upcoming surgery.      Laparoscopic Tubal Ligation Laparoscopic tubal ligation is a procedure to close the fallopian tubes. This is done so that you cannot get pregnant. When the fallopian tubes are closed, the eggs that your ovaries release cannot enter the uterus, and sperm cannot reach the released eggs. You should not have this procedure if you want to get pregnant someday or if you are unsure about having more children. Tell a health care provider about:  Any allergies you have.  All medicines you are taking, including vitamins, herbs, eye drops, creams, and over-the-counter medicines.  Any problems you or family members have had with anesthetic medicines.  Any blood disorders you have.  Any surgeries you have had.  Any medical conditions you have.  Whether you are pregnant or may be pregnant.  Any past pregnancies. What are the risks? Generally, this is a safe procedure. However, problems may occur, including:  Infection.  Bleeding.  Injury to other organs in the abdomen.  Side effects from anesthetic medicines.  Failure of the procedure. This procedure can increase your risk of a kind of pregnancy in which a fertilized egg attaches to the outside of the uterus (ectopic pregnancy). What happens before the procedure? Medicines  Ask your health care provider about: ? Changing or stopping your regular medicines. This is especially important if you are taking diabetes medicines or blood thinners. ? Taking  medicines such as aspirin and ibuprofen. These medicines can thin your blood. Do not take these medicines unless your health care provider tells you to take them. ? Taking over-the-counter medicines, vitamins, herbs, and supplements. Staying hydrated  Follow instructions from your health care provider about hydration, which may include: ? Up to 2 hours before the procedure  - you may continue to drink clear liquids, such as water, clear fruit juice, black coffee, and plain tea. Eating and drinking  Follow instructions from your health care provider about eating and drinking, which may include: ? 8 hours before the procedure - stop eating heavy meals or foods, such as meat, fried foods, or fatty foods. ? 6 hours before the procedure - stop eating light meals or foods, such as toast or cereal. ? 6 hours before the procedure - stop drinking milk or drinks that contain milk. ? 2 hours before the procedure - stop drinking clear liquids. General instructions  Do not use any products that contain nicotine or tobacco for at least 4 weeks before the procedure. These products include cigarettes, e-cigarettes, and chewing tobacco. If you need help quitting, ask your health care provider.  Plan to have someone take you home from the hospital.  If you will be going home right after the procedure, plan to have someone with you for 24 hours.  Ask your health care provider: ? How your surgery site will be marked. ? What steps will be taken to help prevent infection. These may include:  Removing hair at the surgery site.  Washing skin with a germ-killing soap.  Taking antibiotic medicine. What happens during the procedure?      An IV will be inserted into one of your veins.  You will be given one or more of the following: ? A medicine to help you relax (sedative). ? A medicine to numb the area (local anesthetic). ? A medicine to make you fall asleep (general anesthetic). ? A medicine that is injected into an area of your body to numb everything below the injection site (regional anesthetic).  Your bladder may be emptied with a small tube (catheter).  If you have been given a general anesthetic, a tube will be put down your throat to help you breathe.  Two small incisions will be made in your lower abdomen and near your belly button.  Your abdomen will be  inflated with a gas. This will let the surgeon see better and will give the surgeon room to work.  A thin, lighted tube (laparoscope) with a camera attached will be inserted into your abdomen through one of the incisions. Small instruments will be inserted through the other incision.  The fallopian tubes will be tied off, burned (cauterized), or blocked with a clip, ring, or clamp. A small portion in the center of each fallopian tube may be removed.  The gas will be released from the abdomen.  The incisions will be closed with stitches (sutures).  A bandage (dressing) will be placed over the incisions. The procedure may vary among health care providers and hospitals. What happens after the procedure?  Your blood pressure, heart rate, breathing rate, and blood oxygen level will be monitored until you leave the hospital.  You will be given medicine to help with pain, nausea, and vomiting as needed. Summary  Laparoscopic tubal ligation is a procedure that is done so that you cannot get pregnant.  You should not have this procedure if you want to get pregnant someday or if  you are unsure about having more children.  The procedure is done using a thin, lighted tube (laparoscope) with a camera attached that will be inserted into your abdomen through an incision.  Follow instructions from your health care provider about eating and drinking before the procedure. This information is not intended to replace advice given to you by your health care provider. Make sure you discuss any questions you have with your health care provider. Document Revised: 08/31/2018 Document Reviewed: 02/16/2018 Elsevier Patient Education  2020 [employer].

## 2020-01-06 NOTE — Progress Notes (Signed)
   OBSTETRICS POSTPARTUM CLINIC PROGRESS NOTE  Subjective:     Misty Chambers is a 34 y.o. [phone] female who presents for a postpartum visit. She is 4 weeks postpartum following a spontaneous vaginal delivery. I have fully reviewed the prenatal and intrapartum course. The delivery was at 40.5 gestational weeks.  Anesthesia: epidural. Postpartum course has been well. Baby's course has been well. Baby is feeding by breast. Bleeding: patient has not resumed menses, with No LMP recorded. Bowel function is normal. Bladder function is normal. Patient is not sexually active. Contraception method desired is tubal ligation. Postpartum depression screening: negative.  The following portions of the patient's history were reviewed and updated as appropriate: allergies, current medications, past family history, past medical history, past social history, past surgical history and problem list.  Review of Systems Pertinent items noted in HPI and remainder of comprehensive ROS otherwise negative.   Objective:    BP 119/79   Pulse 73   Ht 5\' 4"  (1.626 m)   Wt 155 lb 3.2 oz (70.4 kg)   Breastfeeding Yes   BMI 26.64 kg/m   General:  alert and no distress   Breasts:  inspection negative, no nipple discharge or bleeding, no masses or nodularity palpable  Lungs: clear to auscultation bilaterally  Heart:  regular rate and rhythm, S1, S2 normal, no murmur, click, rub or gallop  Abdomen: soft, non-tender; bowel sounds normal; no masses,  no organomegaly.     Vulva:  normal  Vagina: normal vagina, no discharge, exudate, lesion, or erythema  Cervix:  no cervical motion tenderness and no lesions  Corpus: normal size, contour, position, consistency, mobility, non-tender  Adnexa:  normal adnexa and no mass, fullness, tenderness  Rectal Exam: Not performed.         Labs:  Lab Results  Component Value Date   HGB 11.3 (L) 12/08/2019     Assessment:   1. Postpartum care following vaginal delivery  2.  Tubal ligation evaluation  3. Lactating mother   Plan:   1. Contraception: interval tubal ligation.  Patient desires permanent sterilization.  Other reversible forms of contraception have previously been discussed with patient; she declines all other modalities. Risks of procedure discussed with patient including but not limited to: risk of regret, permanence of method, bleeding, infection, injury to surrounding organs and need for additional procedures.  Failure risk of about 1% with increased risk of ectopic gestation if pregnancy occurs was also discussed with patient.  Also discussed possibility of post-tubal pain syndrome. Patient verbalized understanding of these risks and wants to proceed with sterilization.  Medicaid forms currently still compliant. Pre-op done today. Patient desires to schedule for 01/09/2020. For COVID testing after today's appointment.  2. Lactating mother, doing well.  3. Follow up in: 2 weeks for post-operative visit.     03/10/2020, MD [facility]

## 2020-01-06 NOTE — H&P (Signed)
GYNECOLOGY PREOPERATIVE HISTORY AND PHYSICAL   Subjective:  Misty Chambers is a 34 y.o. [id number] here for surgical management of undesired fertility. She is approximately 4 weeks postpartum. No significant preoperative concerns.  Proposed surgery: Laparoscopic bilateral tubal ligation    Pertinent Gynecological History: Menses: patient is not having menses (recently pregnant) Contraception: none. Patient recently pregnant    OB History  Gravida Para Term Preterm AB Living  4 2 2   2 2   SAB TAB Ectopic Multiple Live Births        0 2    # Outcome Date GA Lbr Len/2nd Weight Sex Delivery Anes PTL Lv  4 Term 12/07/19 [redacted]w[redacted]d  8 lb 2.9 oz (3.71 kg) F Vag-Spont EPI  LIV  3 AB 2019          2 AB 2016          1 Term 05/30/09 [redacted]w[redacted]d  7 lb 11 oz (3.487 kg) F Vag-Spont  N LIV    Past Medical History:  Diagnosis Date  . Bradycardia   . Dyspnea on exertion    a. in setting of pregnancy 09/2019.  10/2019 No pertinent past medical history   . Systolic murmur    a. 09/2019 Echo: EF 60-65%, no rwma, nl RV size/fxn. Mild MR.    Past Surgical History:  Procedure Laterality Date  . APPENDECTOMY      Family History  Problem Relation Age of Onset  . Diabetes Mother   . Heart disease Mother 67       CABG  . COPD Father   . Hepatitis Sister   . Heart attack Paternal Grandfather     Social History   Socioeconomic History  . Marital status: Significant Other    Spouse name: 41  . Number of children: Not on file  . Years of education: Not on file  . Highest education level: Not on file  Occupational History  . Not on file  Tobacco Use  . Smoking status: Never Smoker  . Smokeless tobacco: Never Used  Vaping Use  . Vaping Use: Never used  Substance and Sexual Activity  . Alcohol use: Not Currently  . Drug use: Not Currently    Types: Marijuana  . Sexual activity: Not Currently    Birth control/protection: Surgical  Other Topics Concern  . Not on file  Social History  Narrative  . Not on file   Social Determinants of Health   Financial Resource Strain:   . Difficulty of Paying Living Expenses: Not on file  Food Insecurity:   . Worried About Caryn Bee in the Last Year: Not on file  . Ran Out of Food in the Last Year: Not on file  Transportation Needs:   . Lack of Transportation (Medical): Not on file  . Lack of Transportation (Non-Medical): Not on file  Physical Activity:   . Days of Exercise per Week: Not on file  . Minutes of Exercise per Session: Not on file  Stress:   . Feeling of Stress : Not on file  Social Connections:   . Frequency of Communication with Friends and Family: Not on file  . Frequency of Social Gatherings with Friends and Family: Not on file  . Attends Religious Services: Not on file  . Active Member of Clubs or Organizations: Not on file  . Attends Programme researcher, broadcasting/film/video Meetings: Not on file  . Marital Status: Not on file  Intimate Partner Violence:   . Fear  of Current or Ex-Partner: Not on file  . Emotionally Abused: Not on file  . Physically Abused: Not on file  . Sexually Abused: Not on file    Current Outpatient Medications on File Prior to Visit  Medication Sig Dispense Refill  . ibuprofen (ADVIL) 600 MG tablet Take 1 tablet (600 mg total) by mouth every 6 (six) hours. (Patient not taking: Reported on 01/06/2020) 30 tablet 0  . Prenatal Vit-Fe Phos-FA-Omega (VITAFOL GUMMIES) 3.33-0.333-34.8 MG CHEW Chew 34.8 mg by mouth daily. Chew 3 gummies once a day. 90 tablet 11   No current facility-administered medications on file prior to visit.   No Known Allergies   Review of Systems Constitutional: No recent fever/chills/sweats Respiratory: No recent cough/bronchitis Cardiovascular: No chest pain Gastrointestinal: No recent nausea/vomiting/diarrhea Genitourinary: No UTI symptoms Hematologic/lymphatic:No history of coagulopathy or recent blood thinner use    Objective:   Blood pressure 119/79, pulse 73,  height 5\' 4"  (1.626 m), weight 155 lb 3.2 oz (70.4 kg), currently breastfeeding. CONSTITUTIONAL: Well-developed, well-nourished female in no acute distress.  HENT:  Normocephalic, atraumatic, External right and left ear normal. Oropharynx is clear and moist EYES: Conjunctivae and EOM are normal. Pupils are equal, round, and reactive to light. No scleral icterus.  NECK: Normal range of motion, supple, no masses SKIN: Skin is warm and dry. No rash noted. Not diaphoretic. No erythema. No pallor. NEUROLOGIC: Alert and oriented to person, place, and time. Normal reflexes, muscle tone coordination. No cranial nerve deficit noted. PSYCHIATRIC: Normal mood and affect. Normal behavior. Normal judgment and thought content. CARDIOVASCULAR: Normal heart rate noted, regular rhythm RESPIRATORY: Effort and breath sounds normal, no problems with respiration noted ABDOMEN: Soft, nontender, nondistended. PELVIC: Deferred MUSCULOSKELETAL: Normal range of motion. No edema and no tenderness. 2+ distal pulses.    Labs: Lab Results  Component Value Date   WBC 17.2 (H) 12/08/2019   HGB 11.3 (L) 12/08/2019   HCT 31.4 (L) 12/08/2019   MCV 84.2 12/08/2019   PLT 212 12/08/2019     Imaging Studies: No results found.  Assessment:    Undesired fertility Posptartum state  Plan:   - Counseling: Patient desires permanent sterilization.  Other reversible forms of contraception were discussed with patient; she declines all other modalities. Risks of procedure discussed with patient including but not limited to: risk of regret, permanence of method, bleeding, infection, injury to surrounding organs and need for additional procedures.  Failure risk of about 1% with increased risk of ectopic gestation if pregnancy occurs was also discussed with patient.  Also discussed possibility of post-tubal pain syndrome. Patient verbalized understanding of these risks and wants to proceed with sterilization.   - Preop testing  ordered. - Instructions reviewed, including NPO after midnight.    02/07/2020, MD [facility]

## 2020-01-06 NOTE — H&P (View-Only) (Signed)
GYNECOLOGY PREOPERATIVE HISTORY AND PHYSICAL   Subjective:  Misty Chambers is a 34 y.o. [id number] here for surgical management of undesired fertility. She is approximately 4 weeks postpartum. No significant preoperative concerns.  Proposed surgery: Laparoscopic bilateral tubal ligation    Pertinent Gynecological History: Menses: patient is not having menses (recently pregnant) Contraception: none. Patient recently pregnant    OB History  Gravida Para Term Preterm AB Living  4 2 2   2 2   SAB TAB Ectopic Multiple Live Births        0 2    # Outcome Date GA Lbr Len/2nd Weight Sex Delivery Anes PTL Lv  4 Term 12/07/19 [redacted]w[redacted]d  8 lb 2.9 oz (3.71 kg) F Vag-Spont EPI  LIV  3 AB 2019          2 AB 2016          1 Term 05/30/09 [redacted]w[redacted]d  7 lb 11 oz (3.487 kg) F Vag-Spont  N LIV    Past Medical History:  Diagnosis Date  . Bradycardia   . Dyspnea on exertion    a. in setting of pregnancy 09/2019.  10/2019 No pertinent past medical history   . Systolic murmur    a. 09/2019 Echo: EF 60-65%, no rwma, nl RV size/fxn. Mild MR.    Past Surgical History:  Procedure Laterality Date  . APPENDECTOMY      Family History  Problem Relation Age of Onset  . Diabetes Mother   . Heart disease Mother 67       CABG  . COPD Father   . Hepatitis Sister   . Heart attack Paternal Grandfather     Social History   Socioeconomic History  . Marital status: Significant Other    Spouse name: 41  . Number of children: Not on file  . Years of education: Not on file  . Highest education level: Not on file  Occupational History  . Not on file  Tobacco Use  . Smoking status: Never Smoker  . Smokeless tobacco: Never Used  Vaping Use  . Vaping Use: Never used  Substance and Sexual Activity  . Alcohol use: Not Currently  . Drug use: Not Currently    Types: Marijuana  . Sexual activity: Not Currently    Birth control/protection: Surgical  Other Topics Concern  . Not on file  Social History  Narrative  . Not on file   Social Determinants of Health   Financial Resource Strain:   . Difficulty of Paying Living Expenses: Not on file  Food Insecurity:   . Worried About Caryn Bee in the Last Year: Not on file  . Ran Out of Food in the Last Year: Not on file  Transportation Needs:   . Lack of Transportation (Medical): Not on file  . Lack of Transportation (Non-Medical): Not on file  Physical Activity:   . Days of Exercise per Week: Not on file  . Minutes of Exercise per Session: Not on file  Stress:   . Feeling of Stress : Not on file  Social Connections:   . Frequency of Communication with Friends and Family: Not on file  . Frequency of Social Gatherings with Friends and Family: Not on file  . Attends Religious Services: Not on file  . Active Member of Clubs or Organizations: Not on file  . Attends Programme researcher, broadcasting/film/video Meetings: Not on file  . Marital Status: Not on file  Intimate Partner Violence:   . Fear  of Current or Ex-Partner: Not on file  . Emotionally Abused: Not on file  . Physically Abused: Not on file  . Sexually Abused: Not on file    Current Outpatient Medications on File Prior to Visit  Medication Sig Dispense Refill  . ibuprofen (ADVIL) 600 MG tablet Take 1 tablet (600 mg total) by mouth every 6 (six) hours. (Patient not taking: Reported on 01/06/2020) 30 tablet 0  . Prenatal Vit-Fe Phos-FA-Omega (VITAFOL GUMMIES) 3.33-0.333-34.8 MG CHEW Chew 34.8 mg by mouth daily. Chew 3 gummies once a day. 90 tablet 11   No current facility-administered medications on file prior to visit.   No Known Allergies   Review of Systems Constitutional: No recent fever/chills/sweats Respiratory: No recent cough/bronchitis Cardiovascular: No chest pain Gastrointestinal: No recent nausea/vomiting/diarrhea Genitourinary: No UTI symptoms Hematologic/lymphatic:No history of coagulopathy or recent blood thinner use    Objective:   Blood pressure 119/79, pulse 73,  height 5\' 4"  (1.626 m), weight 155 lb 3.2 oz (70.4 kg), currently breastfeeding. CONSTITUTIONAL: Well-developed, well-nourished female in no acute distress.  HENT:  Normocephalic, atraumatic, External right and left ear normal. Oropharynx is clear and moist EYES: Conjunctivae and EOM are normal. Pupils are equal, round, and reactive to light. No scleral icterus.  NECK: Normal range of motion, supple, no masses SKIN: Skin is warm and dry. No rash noted. Not diaphoretic. No erythema. No pallor. NEUROLOGIC: Alert and oriented to person, place, and time. Normal reflexes, muscle tone coordination. No cranial nerve deficit noted. PSYCHIATRIC: Normal mood and affect. Normal behavior. Normal judgment and thought content. CARDIOVASCULAR: Normal heart rate noted, regular rhythm RESPIRATORY: Effort and breath sounds normal, no problems with respiration noted ABDOMEN: Soft, nontender, nondistended. PELVIC: Deferred MUSCULOSKELETAL: Normal range of motion. No edema and no tenderness. 2+ distal pulses.    Labs: Lab Results  Component Value Date   WBC 17.2 (H) 12/08/2019   HGB 11.3 (L) 12/08/2019   HCT 31.4 (L) 12/08/2019   MCV 84.2 12/08/2019   PLT 212 12/08/2019     Imaging Studies: No results found.  Assessment:    Undesired fertility Posptartum state  Plan:   - Counseling: Patient desires permanent sterilization.  Other reversible forms of contraception were discussed with patient; she declines all other modalities. Risks of procedure discussed with patient including but not limited to: risk of regret, permanence of method, bleeding, infection, injury to surrounding organs and need for additional procedures.  Failure risk of about 1% with increased risk of ectopic gestation if pregnancy occurs was also discussed with patient.  Also discussed possibility of post-tubal pain syndrome. Patient verbalized understanding of these risks and wants to proceed with sterilization.   - Preop testing  ordered. - Instructions reviewed, including NPO after midnight.    02/07/2020, MD [facility]

## 2020-01-07 LAB — SARS CORONAVIRUS 2 (TAT 6-24 HRS): SARS Coronavirus 2: NEGATIVE

## 2020-01-09 ENCOUNTER — Ambulatory Visit
Admission: RE | Admit: 2020-01-09 | Discharge: 2020-01-09 | Disposition: A | Discharge status: Home / Self Care | Payer: Medicaid Other | Attending: Obstetrics and Gynecology | Admitting: Obstetrics and Gynecology

## 2020-01-09 ENCOUNTER — Encounter
Admission: RE | Disposition: A | Discharge status: Home / Self Care | Payer: Self-pay | Source: Home / Self Care | Attending: Obstetrics and Gynecology

## 2020-01-09 ENCOUNTER — Ambulatory Visit: Payer: Medicaid Other | Admitting: Anesthesiology

## 2020-01-09 ENCOUNTER — Other Ambulatory Visit: Payer: Self-pay

## 2020-01-09 ENCOUNTER — Encounter: Payer: Self-pay | Admitting: Obstetrics and Gynecology

## 2020-01-09 DIAGNOSIS — Z302 Encounter for sterilization: Secondary | ICD-10-CM | POA: Diagnosis not present

## 2020-01-09 HISTORY — PX: LAPAROSCOPIC TUBAL LIGATION: SHX1937

## 2020-01-09 LAB — CBC
HCT: 40 % (ref 36.0–46.0)
Hemoglobin: 13.4 g/dL (ref 12.0–15.0)
MCH: 29.1 pg (ref 26.0–34.0)
MCHC: 33.5 g/dL (ref 30.0–36.0)
MCV: 86.8 fL (ref 80.0–100.0)
Platelets: 252 10*3/uL (ref 150–400)
RBC: 4.61 MIL/uL (ref 3.87–5.11)
RDW: 13.1 % (ref 11.5–15.5)
WBC: 7.1 10*3/uL (ref 4.0–10.5)
nRBC: 0 % (ref 0.0–0.2)

## 2020-01-09 LAB — POCT PREGNANCY, URINE: Preg Test, Ur: NEGATIVE

## 2020-01-09 SURGERY — LIGATION, FALLOPIAN TUBE, LAPAROSCOPIC
Anesthesia: General | Site: Abdomen | Laterality: Bilateral

## 2020-01-09 MED ORDER — LIDOCAINE HCL (CARDIAC) PF 100 MG/5ML IV SOSY
PREFILLED_SYRINGE | INTRAVENOUS | Status: DC | PRN
  Administered 2020-01-09: 60 mg via INTRAVENOUS

## 2020-01-09 MED ORDER — ROCURONIUM BROMIDE 100 MG/10ML IV SOLN
INTRAVENOUS | Status: DC | PRN
  Administered 2020-01-09: 50 mg via INTRAVENOUS

## 2020-01-09 MED ORDER — OXYCODONE HCL 5 MG/5ML PO SOLN: 5.0000 mg | Freq: Once | ORAL | Status: DC | PRN

## 2020-01-09 MED ORDER — BUPIVACAINE HCL (PF) 0.5 % IJ SOLN
INTRAMUSCULAR | Status: AC
  Filled 2020-01-09: qty 30

## 2020-01-09 MED ORDER — GABAPENTIN 300 MG PO CAPS: 300.0000 mg | ORAL_CAPSULE | ORAL | Status: AC

## 2020-01-09 MED ORDER — FENTANYL CITRATE (PF) 100 MCG/2ML IJ SOLN: 25.0000 ug | INTRAMUSCULAR | Status: DC | PRN

## 2020-01-09 MED ORDER — FENTANYL CITRATE (PF) 250 MCG/5ML IJ SOLN
INTRAMUSCULAR | Status: DC | PRN
Start: 2020-01-09 — End: 2020-01-09
  Administered 2020-01-09 (×4): 50 ug via INTRAVENOUS

## 2020-01-09 MED ORDER — MIDAZOLAM HCL 2 MG/2ML IJ SOLN
INTRAMUSCULAR | Status: DC | PRN
  Administered 2020-01-09: 2 mg via INTRAVENOUS

## 2020-01-09 MED ORDER — FENTANYL CITRATE (PF) 100 MCG/2ML IJ SOLN
INTRAMUSCULAR | Status: AC
  Filled 2020-01-09: qty 2

## 2020-01-09 MED ORDER — ONDANSETRON HCL 4 MG/2ML IJ SOLN
INTRAMUSCULAR | Status: AC
  Filled 2020-01-09: qty 2

## 2020-01-09 MED ORDER — LIDOCAINE HCL (PF) 2 % IJ SOLN
INTRAMUSCULAR | Status: AC
  Filled 2020-01-09: qty 5

## 2020-01-09 MED ORDER — DEXAMETHASONE SODIUM PHOSPHATE 10 MG/ML IJ SOLN
INTRAMUSCULAR | Status: AC
  Filled 2020-01-09: qty 1

## 2020-01-09 MED ORDER — CHLORHEXIDINE GLUCONATE 0.12 % MT SOLN: 15.0000 mL | Freq: Once | OROMUCOSAL | Status: DC

## 2020-01-09 MED ORDER — SIMETHICONE 80 MG PO CHEW: 80.0000 mg | CHEWABLE_TABLET | Freq: Four times a day (QID) | ORAL | 1 refills | Status: AC | PRN

## 2020-01-09 MED ORDER — SUGAMMADEX SODIUM 200 MG/2ML IV SOLN
INTRAVENOUS | Status: DC | PRN
  Administered 2020-01-09: 200 mg via INTRAVENOUS

## 2020-01-09 MED ORDER — PROPOFOL 10 MG/ML IV BOLUS
INTRAVENOUS | Status: DC | PRN
  Administered 2020-01-09: 120 mg via INTRAVENOUS

## 2020-01-09 MED ORDER — CHLORHEXIDINE GLUCONATE 0.12 % MT SOLN
OROMUCOSAL | Status: AC
  Filled 2020-01-09: qty 15

## 2020-01-09 MED ORDER — MIDAZOLAM HCL 2 MG/2ML IJ SOLN
INTRAMUSCULAR | Status: AC
  Filled 2020-01-09: qty 2

## 2020-01-09 MED ORDER — ORAL CARE MOUTH RINSE: 15.0000 mL | Freq: Once | OROMUCOSAL | Status: DC

## 2020-01-09 MED ORDER — ACETAMINOPHEN 500 MG PO TABS: 1000.0000 mg | ORAL_TABLET | ORAL | Status: AC

## 2020-01-09 MED ORDER — OXYCODONE-ACETAMINOPHEN 5-325 MG PO TABS: 1.0000 | ORAL_TABLET | Freq: Four times a day (QID) | ORAL | 0 refills | Status: AC | PRN

## 2020-01-09 MED ORDER — PROPOFOL 10 MG/ML IV BOLUS
INTRAVENOUS | Status: AC
  Filled 2020-01-09: qty 20

## 2020-01-09 MED ORDER — 0.9 % SODIUM CHLORIDE (POUR BTL) OPTIME
TOPICAL | Status: DC | PRN
  Administered 2020-01-09: 500 mL

## 2020-01-09 MED ORDER — LACTATED RINGERS IV SOLN: INTRAVENOUS | Status: DC

## 2020-01-09 MED ORDER — ONDANSETRON HCL 4 MG/2ML IJ SOLN
INTRAMUSCULAR | Status: DC | PRN
  Administered 2020-01-09: 4 mg via INTRAVENOUS

## 2020-01-09 MED ORDER — ROCURONIUM BROMIDE 10 MG/ML (PF) SYRINGE
PREFILLED_SYRINGE | INTRAVENOUS | Status: AC
  Filled 2020-01-09: qty 10

## 2020-01-09 MED ORDER — DEXAMETHASONE SODIUM PHOSPHATE 10 MG/ML IJ SOLN
INTRAMUSCULAR | Status: DC | PRN
  Administered 2020-01-09: 10 mg via INTRAVENOUS

## 2020-01-09 MED ORDER — OXYCODONE HCL 5 MG PO TABS: 5.0000 mg | ORAL_TABLET | Freq: Once | ORAL | Status: DC | PRN

## 2020-01-09 MED ORDER — ACETAMINOPHEN 500 MG PO TABS
ORAL_TABLET | ORAL | Status: AC
  Administered 2020-01-09: 1000 mg via ORAL
  Filled 2020-01-09: qty 2

## 2020-01-09 MED ORDER — GABAPENTIN 300 MG PO CAPS
ORAL_CAPSULE | ORAL | Status: AC
  Administered 2020-01-09: 300 mg via ORAL
  Filled 2020-01-09: qty 1

## 2020-01-09 MED ORDER — PROMETHAZINE HCL 25 MG/ML IJ SOLN: 6.2500 mg | INTRAMUSCULAR | Status: DC | PRN

## 2020-01-09 MED ORDER — BUPIVACAINE HCL 0.5 % IJ SOLN
INTRAMUSCULAR | Status: DC | PRN
  Administered 2020-01-09: 10 mL

## 2020-01-09 MED ORDER — POVIDONE-IODINE 10 % EX SWAB: 2.0000 "application " | Freq: Once | CUTANEOUS | Status: DC

## 2020-01-09 SURGICAL SUPPLY — 30 items
ADH SKN CLS APL DERMABOND .7 (GAUZE/BANDAGES/DRESSINGS) ×2
APL PRP STRL LF DISP 70% ISPRP (MISCELLANEOUS) ×2
BLADE SURG SZ11 CARB STEEL (BLADE) ×4 IMPLANT
CATH ROBINSON RED A/P 16FR (CATHETERS) ×4 IMPLANT
CHLORAPREP W/TINT 26 (MISCELLANEOUS) ×4 IMPLANT
CLIP FILSHIE TUBAL LIGA STRL (Clip) IMPLANT
COVER WAND RF STERILE (DRAPES) IMPLANT
DERMABOND ADVANCED (GAUZE/BANDAGES/DRESSINGS) ×2
DERMABOND ADVANCED .7 DNX12 (GAUZE/BANDAGES/DRESSINGS) ×2 IMPLANT
GLOVE BIO SURGEON STRL SZ 6.5 (GLOVE) ×3 IMPLANT
GLOVE BIO SURGEONS STRL SZ 6.5 (GLOVE) ×1
GLOVE INDICATOR 7.0 STRL GRN (GLOVE) ×4 IMPLANT
GOWN STRL REUS W/ TWL LRG LVL3 (GOWN DISPOSABLE) ×4 IMPLANT
GOWN STRL REUS W/TWL LRG LVL3 (GOWN DISPOSABLE) ×8
KIT PINK PAD W/HEAD ARE REST (MISCELLANEOUS) ×4
KIT PINK PAD W/HEAD ARM REST (MISCELLANEOUS) ×2 IMPLANT
KIT TURNOVER CYSTO (KITS) ×4 IMPLANT
LABEL OR SOLS (LABEL) ×4 IMPLANT
NS IRRIG 500ML POUR BTL (IV SOLUTION) ×4 IMPLANT
PACK GYN LAPAROSCOPIC (MISCELLANEOUS) ×8 IMPLANT
PAD OB MATERNITY 4.3X12.25 (PERSONAL CARE ITEMS) ×4 IMPLANT
PAD PREP 24X41 OB/GYN DISP (PERSONAL CARE ITEMS) ×4 IMPLANT
SET TUBE SMOKE EVAC HIGH FLOW (TUBING) ×4 IMPLANT
SHEARS ENDO 5MM LNG  ASK BEFOR (MISCELLANEOUS) ×2
SHEARS ENDO 5MM LNG ASK BEFOR (MISCELLANEOUS) ×2 IMPLANT
SUT VIC AB 0 CT2 27 (SUTURE) ×4 IMPLANT
SUT VIC AB 4-0 SH 27 (SUTURE) ×4
SUT VIC AB 4-0 SH 27XANBCTRL (SUTURE) ×2 IMPLANT
TROCAR ENDO BLADELESS 11MM (ENDOMECHANICALS) ×4 IMPLANT
TROCAR XCEL 12X100 BLDLESS (ENDOMECHANICALS) ×4 IMPLANT

## 2020-01-09 NOTE — Op Note (Signed)
Procedure(s): LAPAROSCOPIC TUBAL LIGATION Procedure Note  [name] female 34 y.o. 01/09/2020  Indications: The patient is a 34 y.o. [phone] female with undesired fertility.    Pre-operative Diagnosis: Undesired fertility  Post-operative Diagnosis: Same  Surgeon: Hildred Laser, MD  Assistants:  Surgical scrub assist.   Anesthesia: General endotracheal anesthesia  Procedure Details: The patient was seen in the Holding Room. The risks, benefits, complications, treatment options, and expected outcomes were discussed with the patient.  The patient concurred with the proposed plan, giving informed consent.  The site of surgery properly noted/marked. The patient was taken to the Operating Room, identified as Misty Chambers and the procedure verified as Procedure(s) (LRB): LAPAROSCOPIC TUBAL LIGATION (Bilateral). A Time Out was held and the above information confirmed.  She was then placed under general anesthesia without difficulty. She was placed in the dorsal lithotomy position, and was prepped and draped in a sterile manner.  A straight catheterization was performed. A sterile speculum was inserted into the vagina and the cervix was grasped at the anterior lip using a single-toothed tenaculum.    The uterine manipulator was then advanced into the uterus.  The tenaculum and speculum was removed from the vagina.  Attention was then turned to the patient's abdomen where a 12-mm skin incision was made in the umbilical fold.  The Optiview 12-mm trocar and sleeve were then advanced without difficulty with the laparoscope under direct visualization into the abdomen.  The abdomen was then insufflated with carbon dioxide gas and adequate pneumoperitoneum was obtained.  A survey of the patient's pelvis and abdomen revealed entirely normal anatomy.   The fallopian tubes were observed and found to be normal in appearance. Bipolar forceps was then advanced through the operative port and used  to coagulate a 3 cm portion of the left tube in the mid isthmic area.  Good blanching and coagulation was noted at the site of the application. The coagulated site was then incised in the middle with cold endoshears.  There was no bleeding noted in the mesosalpinx.  A similar process was carried out on the right fallopian tube allowing for bilateral tubal sterilization.   Good hemostasis was noted overall. The instruments were then removed from the patient's abdomen and the fascial incision was repaired with 0 Vicryl.  The skin was repaired with a running suture of 4-0 Monocryl and then covered with Dermabond.  The uterine manipulator  were removed from the vagina without complications.   The patient tolerated the procedure well.  Sponge, lap, and needle counts were correct times two.  The patient was then taken to the recovery room awake, extubated and in stable condition.    Findings:  The uterus appeared normal size.  Fallopian tubes and ovaries appeared normal.   Estimated Blood Loss:  5 ml      Drains: straight catheterization prior to procedure with  20 ml of clear urine         Total IV Fluids:  Total I/O In: 800 [I.V.:800] Out: 25 [Urine:20; Blood:5]  ml  Specimens: None         Implants: None         Complications:  None; patient tolerated the procedure well.         Disposition: PACU - hemodynamically stable.         Condition: stable   Hildred Laser, MD [facility]

## 2020-01-09 NOTE — Anesthesia Procedure Notes (Signed)
Procedure Name: Intubation Date/Time: 01/09/2020 1:13 PM Performed by: Eben Burow, CRNA Pre-anesthesia Checklist: Patient identified, Emergency Drugs available, Suction available and Patient being monitored Patient Re-evaluated:Patient Re-evaluated prior to induction Oxygen Delivery Method: Circle system utilized Preoxygenation: Pre-oxygenation with 100% oxygen Induction Type: IV induction Ventilation: Mask ventilation without difficulty Laryngoscope Size: Mac and 3 Grade View: Grade I Tube type: Oral Tube size: 7.0 mm Number of attempts: 1 Placement Confirmation: ETT inserted through vocal cords under direct vision,  positive ETCO2 and breath sounds checked- equal and bilateral Secured at: 21 cm Tube secured with: Tape Dental Injury: Teeth and Oropharynx as per pre-operative assessment

## 2020-01-09 NOTE — Interval H&P Note (Signed)
History and Physical Interval Note:  01/09/2020 12:50 PM  Misty Chambers  has presented today for surgery, with the diagnosis of undesired fertility.  The various methods of treatment have been discussed with the patient and family. After consideration of risks, benefits and other options for treatment, the patient has consented to  Procedure(s): LAPAROSCOPIC BILATERAL TUBAL LIGATION (Bilateral) as a surgical intervention.  The patient's history has been reviewed, patient examined, no change in status, stable for surgery.  I have reviewed the patient's chart and labs.  Questions were answered to the patient's satisfaction.     Hildred Laser, MD [facility]

## 2020-01-09 NOTE — Discharge Instructions (Signed)
AMBULATORY SURGERY  DISCHARGE INSTRUCTIONS   1) The drugs that you were given will stay in your system until tomorrow so for the next 24 hours you should not:  A) Drive an automobile B) Make any legal decisions C) Drink any alcoholic beverage   2) You may resume regular meals tomorrow.  Today it is better to start with liquids and gradually work up to solid foods.  You may eat anything you prefer, but it is better to start with liquids, then soup and crackers, and gradually work up to solid foods.   3) Please notify your doctor immediately if you have any unusual bleeding, trouble breathing, redness and pain at the surgery site, drainage, fever, or pain not relieved by medication.    4) Additional Instructions:        Please contact your physician with any problems or Same Day Surgery at [phone], Monday through Friday 6 am to 4 pm, or [facility] number at [phone].    Laparoscopic Tubal Ligation, Care After This sheet gives you information about how to care for yourself after your procedure. Your health care provider may also give you more specific instructions. If you have problems or questions, contact your health care provider. What can I expect after the procedure? After the procedure, it is common to have:  A sore throat.  Discomfort in your shoulder.  Mild discomfort or cramping in your abdomen.  Gas pains.  Pain or soreness in the area where the surgical incision was made.  A bloated feeling.  Tiredness.  Nausea.  Vomiting. Follow these instructions at home: Medicines  Take over-the-counter and prescription medicines only as told by your health care provider.  Do not take aspirin because it can cause bleeding.  Ask your health care provider if the medicine prescribed to you: ? Requires you to avoid driving or using heavy machinery. ? Can cause constipation. You may need to take actions to prevent or treat constipation,  such as:  Drink enough fluid to keep your urine pale yellow.  Take over-the-counter or prescription medicines.  Eat foods that are high in fiber, such as beans, whole grains, and fresh fruits and vegetables.  Limit foods that are high in fat and processed sugars, such as fried or sweet foods. Incision care      Follow instructions from your health care provider about how to take care of your incision. Make sure you: ? Wash your hands with soap and water before and after you change your bandage (dressing). If soap and water are not available, use hand sanitizer. ? Change your dressing as told by your health care provider. ? Leave stitches (sutures), skin glue, or adhesive strips in place. These skin closures may need to stay in place for 2 weeks or longer. If adhesive strip edges start to loosen and curl up, you may trim the loose edges. Do not remove adhesive strips completely unless your health care provider tells you to do that.  Check your incision area every day for signs of infection. Check for: ? Redness, swelling, or pain. ? Fluid or blood. ? Warmth. ? Pus or a bad smell. Activity  Rest as told by your health care provider.  Avoid sitting for a long time without moving. Get up to take short walks every 1-2 hours. This is important to improve blood flow and breathing. Ask for help if you feel weak or unsteady.  Return to your normal activities as told by your health care   provider. Ask your health care provider what activities are safe for you. General instructions  Do not take baths, swim, or use a hot tub until your health care provider approves. Ask your health care provider if you may take showers. You may only be allowed to take sponge baths.  Have someone help you with your daily household tasks for the first few days.  Keep all follow-up visits as told by your health care provider. This is important. Contact a health care provider if:  You have redness, swelling,  or pain around your incision.  Your incision feels warm to the touch.  You have pus or a bad smell coming from your incision.  The edges of your incision break open after the sutures have been removed.  Your pain does not improve after 2-3 days.  You have a rash.  You repeatedly become dizzy or light-headed.  Your pain medicine is not helping. Get help right away if you:  Have a fever.  Faint.  Have increasing pain in your abdomen.  Have severe pain in one or both of your shoulders.  Have fluid or blood coming from your sutures or from your vagina.  Have shortness of breath or difficulty breathing.  Have chest pain or leg pain.  Have ongoing nausea, vomiting, or diarrhea. Summary  After the procedure, it is common to have mild discomfort or cramping in your abdomen.  Take over-the-counter and prescription medicines only as told by your health care provider.  Watch for symptoms that should prompt you to call your health care provider.  Keep all follow-up visits as told by your health care provider. This is important. This information is not intended to replace advice given to you by your health care provider. Make sure you discuss any questions you have with your health care provider. Document Revised: 08/31/2018 Document Reviewed: 02/16/2018 Elsevier Patient Education  2020 Elsevier Inc[employer]  

## 2020-01-09 NOTE — Transfer of Care (Signed)
Immediate Anesthesia Transfer of Care Note  Patient: Misty Chambers  Procedure(s) Performed: LAPAROSCOPIC TUBAL LIGATION (Bilateral Abdomen)  Patient Location: PACU  Anesthesia Type:General  Level of Consciousness: drowsy  Airway & Oxygen Therapy: Patient Spontanous Breathing and Patient connected to face mask oxygen  Post-op Assessment: Report given to RN and Post -op Vital signs reviewed and stable  Post vital signs: Reviewed and stable  Last Vitals:  Vitals Value Taken Time  BP 153/102 01/09/20 1423  Temp    Pulse 54 01/09/20 1424  Resp 10 01/09/20 1424  SpO2 100 % 01/09/20 1424  Vitals shown include unvalidated device data.  Last Pain:  Vitals:   01/09/20 1222  TempSrc: Tympanic  PainSc: 0-No pain         Complications: No complications documented.

## 2020-01-09 NOTE — Anesthesia Preprocedure Evaluation (Signed)
Anesthesia Evaluation  Patient identified by MRN, date of birth, ID band Patient awake    Reviewed: Allergy & Precautions, H&P , NPO status , Patient's Chart, lab work & pertinent test results  History of Anesthesia Complications Negative for: history of anesthetic complications  Airway Mallampati: II  TM Distance: >3 FB Neck ROM: full    Dental  (+) Chipped   Pulmonary neg pulmonary ROS, neg shortness of breath,    Pulmonary exam normal        Cardiovascular (-) angina(-) Past MI and (-) DOE Normal cardiovascular exam     Neuro/Psych negative neurological ROS  negative psych ROS   GI/Hepatic negative GI ROS, Neg liver ROS, neg GERD  ,  Endo/Other  negative endocrine ROS  Renal/GU      Musculoskeletal   Abdominal   Peds  Hematology negative hematology ROS (+)   Anesthesia Other Findings Past Medical History: No date: Bradycardia No date: Dyspnea on exertion     Comment:  a. in setting of pregnancy 09/2019. No date: No pertinent past medical history No date: Systolic murmur     Comment:  a. 09/2019 Echo: EF 60-65%, no rwma, nl RV size/fxn. Mild              MR.  Past Surgical History: No date: APPENDECTOMY     Reproductive/Obstetrics (+) Breast feeding                              Anesthesia Physical Anesthesia Plan  ASA: II  Anesthesia Plan: General ETT   Post-op Pain Management:    Induction: Intravenous  PONV Risk Score and Plan: Ondansetron, Dexamethasone, Midazolam and Treatment may vary due to age or medical condition  Airway Management Planned: Oral ETT  Additional Equipment:   Intra-op Plan:   Post-operative Plan: Extubation in OR  Informed Consent: I have reviewed the patients History and Physical, chart, labs and discussed the procedure including the risks, benefits and alternatives for the proposed anesthesia with the patient or authorized representative  who has indicated his/her understanding and acceptance.     Dental Advisory Given  Plan Discussed with: Anesthesiologist, CRNA and Surgeon  Anesthesia Plan Comments: (Patient consented for risks of anesthesia including but not limited to:  - adverse reactions to medications - damage to eyes, teeth, lips or other oral mucosa - nerve damage due to positioning  - sore throat or hoarseness - Damage to heart, brain, nerves, lungs, other parts of body or loss of life  Patient voiced understanding.)        Anesthesia Quick Evaluation

## 2020-01-10 ENCOUNTER — Encounter: Payer: Self-pay | Admitting: Obstetrics and Gynecology

## 2020-01-10 NOTE — Anesthesia Postprocedure Evaluation (Signed)
Anesthesia Post Note  Patient: Alen Blew  Procedure(s) Performed: LAPAROSCOPIC TUBAL LIGATION (Bilateral Abdomen)  Patient location during evaluation: PACU Anesthesia Type: General Level of consciousness: awake and alert Pain management: pain level controlled Vital Signs Assessment: post-procedure vital signs reviewed and stable Respiratory status: spontaneous breathing, nonlabored ventilation, respiratory function stable and patient connected to nasal cannula oxygen Cardiovascular status: blood pressure returned to baseline and stable Postop Assessment: no apparent nausea or vomiting Anesthetic complications: no   No complications documented.   Last Vitals:  Vitals:   01/09/20 1510 01/09/20 1546  BP: (!) 141/90 124/80  Pulse: (!) 57 (!) 54  Resp: 16 16  Temp: 36.5 C   SpO2: 99% 100%    Last Pain:  Vitals:   01/09/20 1546  TempSrc:   PainSc: 2                  Cleda Mccreedy Joaquina Nissen

## 2020-01-18 ENCOUNTER — Other Ambulatory Visit: Payer: Self-pay

## 2020-01-18 ENCOUNTER — Encounter: Payer: Self-pay | Admitting: Obstetrics and Gynecology

## 2020-01-18 ENCOUNTER — Ambulatory Visit (INDEPENDENT_AMBULATORY_CARE_PROVIDER_SITE_OTHER): Payer: Medicaid Other | Admitting: Obstetrics and Gynecology

## 2020-01-18 VITALS — BP 124/82 | HR 82 | Ht 64.0 in | Wt 153.2 lb

## 2020-01-18 DIAGNOSIS — Z9851 Tubal ligation status: Secondary | ICD-10-CM

## 2020-01-18 DIAGNOSIS — Z4889 Encounter for other specified surgical aftercare: Secondary | ICD-10-CM

## 2020-01-18 NOTE — Progress Notes (Signed)
    OBSTETRICS/GYNECOLOGY POST-OPERATIVE CLINIC VISIT  Subjective:     Misty Chambers is a 34 y.o. female who presents to the clinic 1 weeks status post laparoscopic BTL for requested sterilization. Eating a regular diet without difficulty. The patient is not having any pain.  The following portions of the patient's history were reviewed and updated as appropriate: allergies, current medications, past family history, past medical history, past social history, past surgical history and problem list.  Review of Systems Pertinent items noted in HPI and remainder of comprehensive ROS otherwise negative.    Objective:    BP 124/82   Pulse 82   Ht 5\' 4"  (1.626 m)   Wt 153 lb 3.2 oz (69.5 kg)   Breastfeeding Yes   BMI 26.30 kg/m  General:  alert and no distress  Abdomen: soft, bowel sounds active, non-tender  Incision:   healing well, no drainage, no erythema, no hernia, no seroma, no swelling, no dehiscence, incision well approximated    Pathology:    Assessment:    Doing well postoperatively.   Plan:   1. Continue any current medications as needed. 2. Wound care discussed. 3. Operative findings again reviewed.  4. Activity restrictions: none 5. Anticipated return to work: 5 months. 6. Follow up: 6 months for annual exam.  7. Declines flu vaccine.     , MD [facility]

## 2020-01-18 NOTE — Progress Notes (Signed)
Pt present for wound check after tubal ligation. Pt stated that she was healing well and denies any issues at this time.

## 2020-07-10 ENCOUNTER — Ambulatory Visit: Payer: Medicaid Other | Admitting: Medical-Surgical

## 2020-07-16 ENCOUNTER — Other Ambulatory Visit: Payer: Self-pay | Admitting: Physician Assistant

## 2020-07-16 DIAGNOSIS — R1084 Generalized abdominal pain: Secondary | ICD-10-CM

## 2020-07-19 ENCOUNTER — Other Ambulatory Visit (HOSPITAL_COMMUNITY)
Admission: RE | Admit: 2020-07-19 | Discharge: 2020-07-19 | Disposition: A | Discharge status: Home / Self Care | Payer: Medicaid Other | Source: Ambulatory Visit | Attending: Obstetrics and Gynecology | Admitting: Obstetrics and Gynecology

## 2020-07-19 ENCOUNTER — Encounter: Payer: Self-pay | Admitting: Obstetrics and Gynecology

## 2020-07-19 ENCOUNTER — Ambulatory Visit (INDEPENDENT_AMBULATORY_CARE_PROVIDER_SITE_OTHER): Payer: Medicaid Other | Admitting: Obstetrics and Gynecology

## 2020-07-19 ENCOUNTER — Other Ambulatory Visit: Payer: Self-pay

## 2020-07-19 VITALS — BP 111/75 | HR 79 | Ht 64.0 in | Wt 158.6 lb

## 2020-07-19 DIAGNOSIS — Z124 Encounter for screening for malignant neoplasm of cervix: Secondary | ICD-10-CM | POA: Insufficient documentation

## 2020-07-19 DIAGNOSIS — N943 Premenstrual tension syndrome: Secondary | ICD-10-CM

## 2020-07-19 DIAGNOSIS — R1084 Generalized abdominal pain: Secondary | ICD-10-CM

## 2020-07-19 DIAGNOSIS — Z01419 Encounter for gynecological examination (general) (routine) without abnormal findings: Secondary | ICD-10-CM

## 2020-07-19 DIAGNOSIS — E663 Overweight: Secondary | ICD-10-CM

## 2020-07-19 NOTE — Progress Notes (Signed)
Annual Exam-Pt present for annual exam. Pt stated that she was doing well.

## 2020-07-19 NOTE — Patient Instructions (Addendum)
Preventive Care 21-36 Years Old, Female Preventive care refers to lifestyle choices and visits with your health care provider that can promote health and wellness. This includes:  A yearly physical exam. This is also called an annual wellness visit.  Regular dental and eye exams.  Immunizations.  Screening for certain conditions.  Healthy lifestyle choices, such as: ? Eating a healthy diet. ? Getting regular exercise. ? Not using drugs or products that contain nicotine and tobacco. ? Limiting alcohol use. What can I expect for my preventive care visit? Physical exam Your health care provider may check your:  Height and weight. These may be used to calculate your BMI (body mass index). BMI is a measurement that tells if you are at a healthy weight.  Heart rate and blood pressure.  Body temperature.  Skin for abnormal spots. Counseling Your health care provider may ask you questions about your:  Past medical problems.  Family's medical history.  Alcohol, tobacco, and drug use.  Emotional well-being.  Home life and relationship well-being.  Sexual activity.  Diet, exercise, and sleep habits.  Work and work environment.  Access to firearms.  Method of birth control.  Menstrual cycle.  Pregnancy history. What immunizations do I need? Vaccines are usually given at various ages, according to a schedule. Your health care provider will recommend vaccines for you based on your age, medical history, and lifestyle or other factors, such as travel or where you work.   What tests do I need? Blood tests  Lipid and cholesterol levels. These may be checked every 5 years starting at age 20.  Hepatitis C test.  Hepatitis B test. Screening  Diabetes screening. This is done by checking your blood sugar (glucose) after you have not eaten for a while (fasting).  STD (sexually transmitted disease) testing, if you are at risk.  BRCA-related cancer screening. This may be  done if you have a family history of breast, ovarian, tubal, or peritoneal cancers.  Pelvic exam and Pap test. This may be done every 3 years starting at age 21. Starting at age 30, this may be done every 5 years if you have a Pap test in combination with an HPV test. Talk with your health care provider about your test results, treatment options, and if necessary, the need for more tests.   Follow these instructions at home: Eating and drinking  Eat a healthy diet that includes fresh fruits and vegetables, whole grains, lean protein, and low-fat dairy products.  Take vitamin and mineral supplements as recommended by your health care provider.  Do not drink alcohol if: ? Your health care provider tells you not to drink. ? You are pregnant, may be pregnant, or are planning to become pregnant.  If you drink alcohol: ? Limit how much you have to 0-1 drink a day. ? Be aware of how much alcohol is in your drink. In the [place] one drink equals one 12 oz bottle of beer (355 mL), one 5 oz glass of wine (148 mL), or one 1 oz glass of hard liquor (44 mL).   Lifestyle  Take daily care of your teeth and gums. Brush your teeth every morning and night with fluoride toothpaste. Floss one time each day.  Stay active. Exercise for at least 30 minutes 5 or more days each week.  Do not use any products that contain nicotine or tobacco, such as cigarettes, e-cigarettes, and chewing tobacco. If you need help quitting, ask your health care provider.  Do not   use drugs.  If you are sexually active, practice safe sex. Use a condom or other form of protection to prevent STIs (sexually transmitted infections).  If you do not wish to become pregnant, use a form of birth control. If you plan to become pregnant, see your health care provider for a prepregnancy visit.  Find healthy ways to cope with stress, such as: ? Meditation, yoga, or listening to music. ? Journaling. ? Talking to a trusted  person. ? Spending time with friends and family. Safety  Always wear your seat belt while driving or riding in a vehicle.  Do not drive: ? If you have been drinking alcohol. Do not ride with someone who has been drinking. ? When you are tired or distracted. ? While texting.  Wear a helmet and other protective equipment during sports activities.  If you have firearms in your house, make sure you follow all gun safety procedures.  Seek help if you have been physically or sexually abused. What's next?  Go to your health care provider once a year for an annual wellness visit.  Ask your health care provider how often you should have your eyes and teeth checked.  Stay up to date on all vaccines. This information is not intended to replace advice given to you by your health care provider. Make sure you discuss any questions you have with your health care provider. Document Revised: 11/20/2019 Document Reviewed: 12/03/2017 Elsevier Patient Education  2021 [place] Breast self-awareness is knowing how your breasts look and feel. Doing breast self-awareness is important. It allows you to catch a breast problem early while it is still small and can be treated. All women should do breast self-awareness, including women who have had breast implants. Tell your doctor if you notice a change in your breasts. What you need:  A mirror.  A well-lit room. How to do a breast self-exam A breast self-exam is one way to learn what is normal for your breasts and to check for changes. To do a breast self-exam: Look for changes 1. Take off all the clothes above your waist. 2. Stand in front of a mirror in a room with good lighting. 3. Put your hands on your hips. 4. Push your hands down. 5. Look at your breasts and nipples in the mirror to see if one breast or nipple looks different from the other. Check to see if: ? The shape of one breast is different. ? The size of one  breast is different. ? There are wrinkles, dips, and bumps in one breast and not the other. 6. Look at each breast for changes in the skin, such as: ? Redness. ? Scaly areas. 7. Look for changes in your nipples, such as: ? Liquid around the nipples. ? Bleeding. ? Dimpling. ? Redness. ? A change in where the nipples are.   Feel for changes 1. Lie on your back on the floor. 2. Feel each breast. To do this, follow these steps: ? Pick a breast to feel. ? Put the arm closest to that breast above your head. ? Use your other arm to feel the nipple area of your breast. Feel the area with the pads of your three middle fingers by making small circles with your fingers. For the first circle, press lightly. For the second circle, press harder. For the third circle, press even harder. ? Keep making circles with your fingers at the different pressures as you move down your breast. Stop when  you feel your ribs. ? Move your fingers a little toward the center of your body. ? Start making circles with your fingers again, this time going up until you reach your collarbone. ? Keep making up-and-down circles until you reach your armpit. Remember to keep using the three pressures. ? Feel the other breast in the same way. 3. Sit or stand in the tub or shower. 4. With soapy water on your skin, feel each breast the same way you did in step 2 when you were lying on the floor.   Write down what you find Writing down what you find can help you remember what to tell your doctor. Write down:  What is normal for each breast.  Any changes you find in each breast, including: ? The kind of changes you find. ? Whether you have pain. ? Size and location of any lumps.  When you last had your menstrual period. General tips  Check your breasts every month.  If you are breastfeeding, the best time to check your breasts is after you feed your baby or after you use a breast pump.  If you get menstrual periods, the  best time to check your breasts is 5-7 days after your menstrual period is over.  With time, you will become comfortable with the self-exam, and you will begin to know if there are changes in your breasts. Contact a doctor if you:  See a change in the shape or size of your breasts or nipples.  See a change in the skin of your breast or nipples, such as red or scaly skin.  Have fluid coming from your nipples that is not normal.  Find a lump or thick area that was not there before.  Have pain in your breasts.  Have any concerns about your breast health. Summary  Breast self-awareness includes looking for changes in your breasts, as well as feeling for changes within your breasts.  Breast self-awareness should be done in front of a mirror in a well-lit room.  You should check your breasts every month. If you get menstrual periods, the best time to check your breasts is 5-7 days after your menstrual period is over.  Let your doctor know of any changes you see in your breasts, including changes in size, changes on the skin, pain or tenderness, or fluid from your nipples that is not normal. This information is not intended to replace advice given to you by your health care provider. Make sure you discuss any questions you have with your health care provider. Document Revised: 11/10/2017 Document Reviewed: 11/10/2017 Elsevier Patient Education  2021 [place].     Premenstrual Syndrome Premenstrual syndrome (PMS) is a group of physical, emotional, and behavioral symptoms that affect women as part of their menstrual cycle. PMS occurs 1-2 weeks before the start of a woman's menstrual period and goes away a few days after menstrual bleeding begins. PMS can range from mild to severe. What are the causes? The exact cause of this condition is not known, but it seems to be related to hormone changes that happen before menstruation. What are the signs or symptoms? Symptoms of this condition  often happen every month. They go away after your period starts. Physical symptoms of this condition include:  Bloating.  Breast pain or tenderness.  Headaches.  Extreme fatigue.  Backaches.  Swelling of the hands and feet.  Weight gain.  Hot flashes. Emotional symptoms of this condition include:  Mood swings.  Depression.  Angry or hostile outbursts.  Irritability.  Anxiety.  Crying spells. Behavioral symptoms include:  Food cravings or appetite changes.  Changes in sexual desire.  Confusion.  Social withdrawal.  Poor concentration. How is this diagnosed? This condition may be diagnosed based on a history of your symptoms. This condition is generally diagnosed if symptoms of PMS:  Are present in the 5 days before your period starts.  End within 4 days after your period starts.  Happen at least 3 months in a row.  Interfere with some of your normal activities. Other conditions that can cause some of these symptoms must be ruled out before PMS can be diagnosed. These include depression, anxiety, anemia, and thyroid problems. How is this treated? This condition may be treated by doing the following:  Maintaining a healthy lifestyle. This includes eating a well-balanced diet and exercising regularly.  Taking over-the-counter medicines that can help relieve symptoms, such as cramps, aches, pain, headaches, and breast tenderness. Follow these instructions at home: Eating and drinking  Eat a well-balanced diet.  Avoid caffeine and alcohol.  Limit the amount of salt and salty foods you eat. This will help reduce bloating.  Drink enough fluid to keep your urine pale yellow.  Take a multivitamin if told to do so by your health care provider.   Lifestyle  Do not use any products that contain nicotine or tobacco. These products include cigarettes, chewing tobacco, and vaping devices, such as e-cigarettes. If you need help quitting, ask your health care  provider.  Exercise regularly as suggested by your health care provider.  Get enough sleep. For most adults, this is 7-8 hours of sleep each night.  Practice relaxation techniques, such as yoga, tai chi, or meditation.  Find healthy ways to manage stress.   General instructions  For 2-3 months, write down your symptoms, whether they are mild to severe, and how long they last. This will help your health care provider choose the best treatment for you.  Take over-the-counter and prescription medicines only as told by your health care provider.  If you are using birth control pills (oral contraceptives), use them as told by your health care provider.   Contact a health care provider if:  Your symptoms get worse.  You develop new symptoms.  You have trouble doing your daily activities. Summary  Premenstrual syndrome (PMS) is a group of physical, emotional, and behavioral symptoms that affect women as part of their menstrual cycle.  PMS starts 1-2 weeks before the start of a woman's period and goes away a few days after the period starts.  PMS is treated by maintaining a healthy lifestyle and taking medicines to relieve the symptoms. This information is not intended to replace advice given to you by your health care provider. Make sure you discuss any questions you have with your health care provider. Document Revised: 11/11/2019 Document Reviewed: 11/11/2019 Elsevier Patient Education  2021 [employer].
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
## 2020-07-24 LAB — CYTOLOGY - PAP
Comment: NEGATIVE
Diagnosis: NEGATIVE
Diagnosis: REACTIVE
High risk HPV: NEGATIVE

## 2020-08-01 ENCOUNTER — Telehealth: Payer: Self-pay | Admitting: Obstetrics and Gynecology

## 2020-08-01 NOTE — Telephone Encounter (Signed)
Misty Chambers called from [employer] and states she needs a sterilization form for Misty Chambers from her procedure on 01/09/2020 with Dr. Valentino Saxon.  Misty Chambers would like it emailed to her at:  [email or web address]

## 2020-08-03 NOTE — Telephone Encounter (Signed)
Emailed consent to [employer].

## 2020-08-07 ENCOUNTER — Other Ambulatory Visit: Payer: Self-pay

## 2020-08-07 ENCOUNTER — Ambulatory Visit
Admission: RE | Admit: 2020-08-07 | Discharge: 2020-08-07 | Disposition: A | Discharge status: Home / Self Care | Payer: Medicaid Other | Source: Ambulatory Visit | Attending: Physician Assistant | Admitting: Physician Assistant

## 2020-08-07 DIAGNOSIS — R1084 Generalized abdominal pain: Secondary | ICD-10-CM | POA: Diagnosis not present

## 2022-02-16 IMAGING — US US ABDOMEN COMPLETE
1 series · 14 of 25 positions shown · non-contrast
Comparison: None.

CLINICAL DATA: Pain after meals

EXAM:
ABDOMEN ULTRASOUND COMPLETE

[Series 1: us abdomen complete · 0.17mm/px · 14 of 92 slices shown]
[im 1/92]
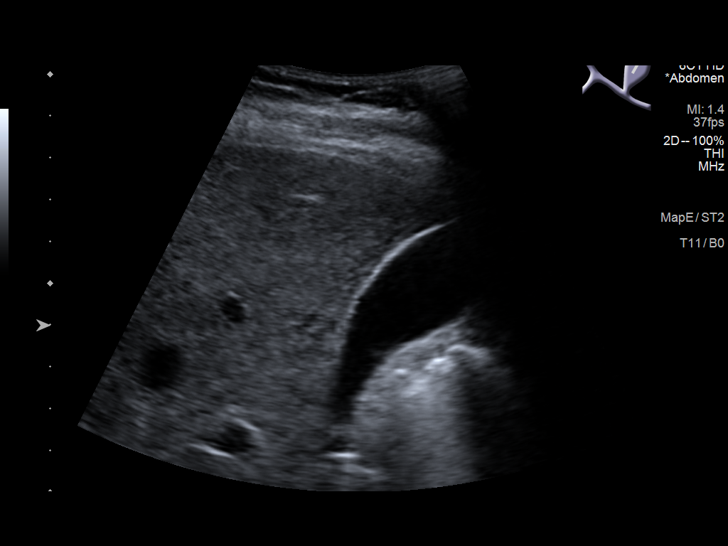
[im 8/92]
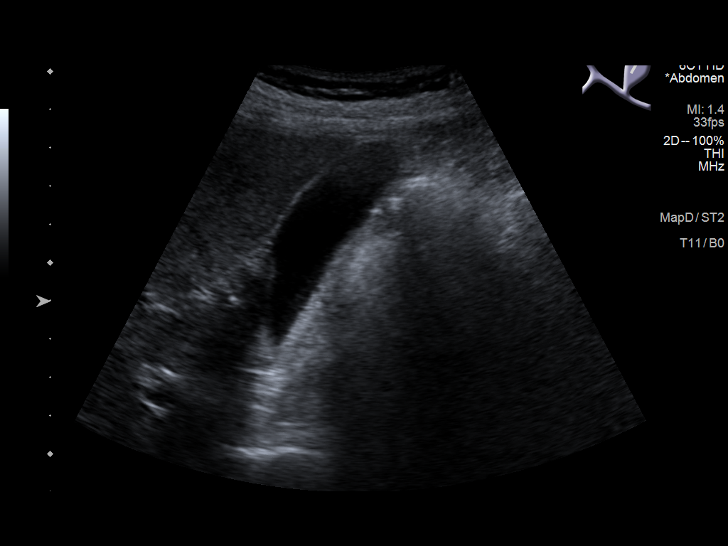
[im 16/92]
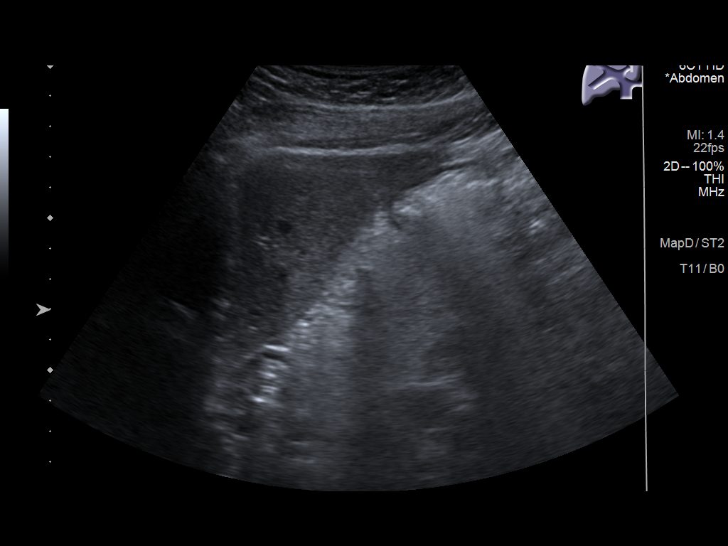
[im 23/92]
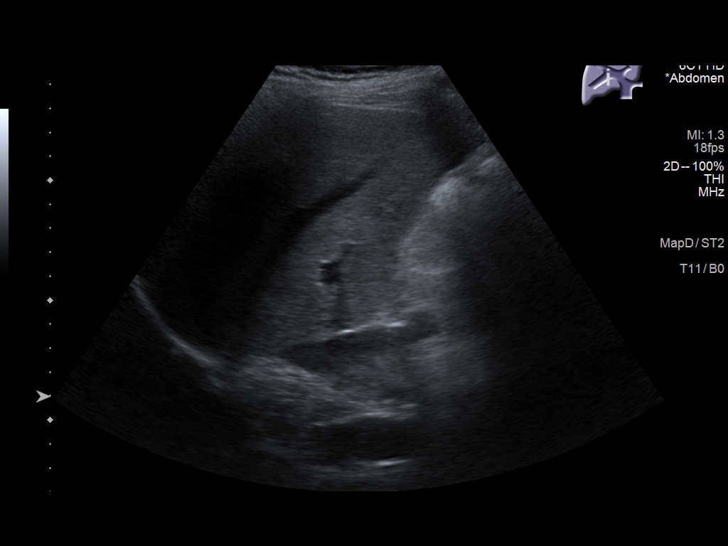
[im 31/92]
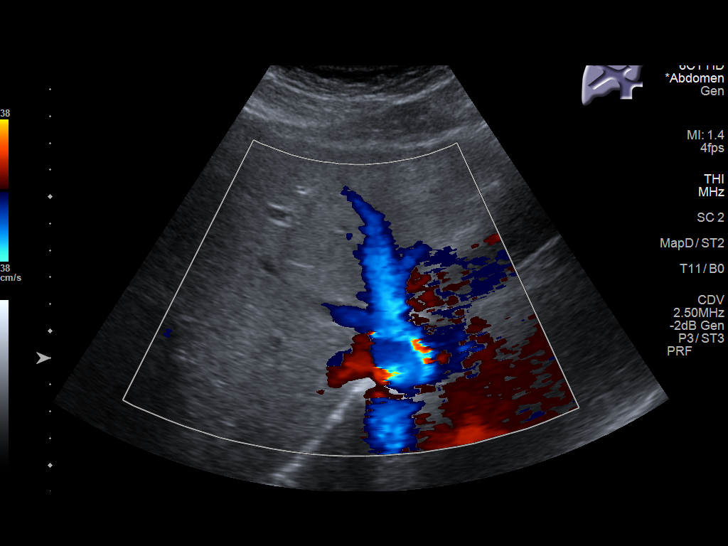
[im 35/92]
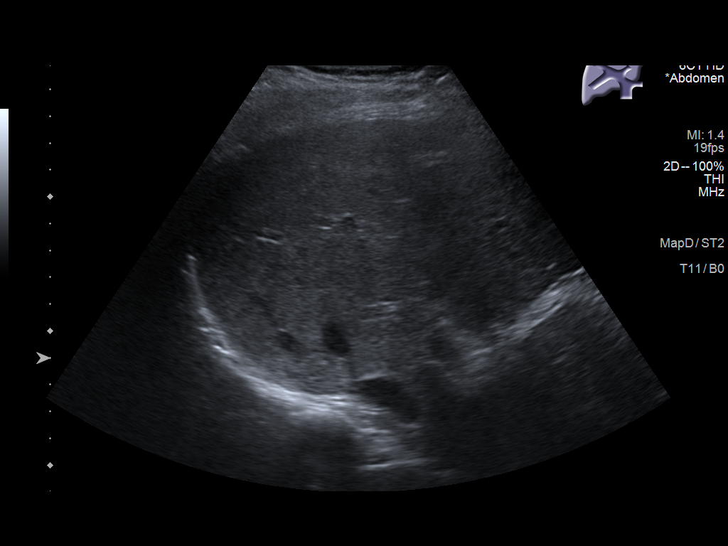
[im 42/92]
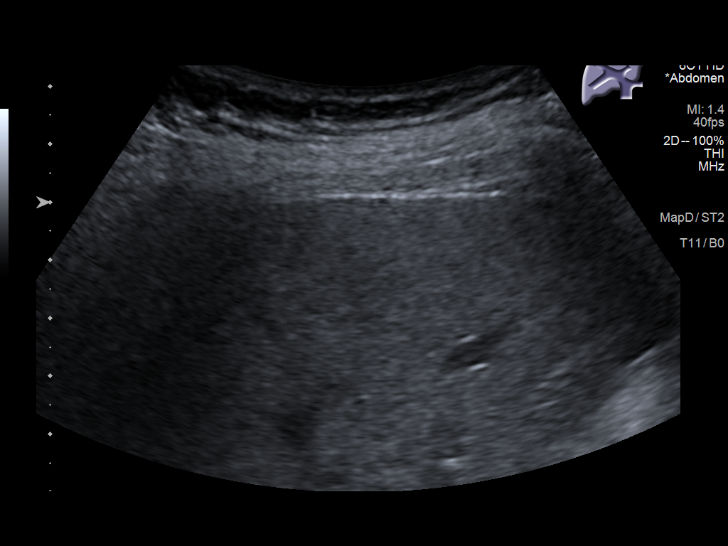
[im 50/92]
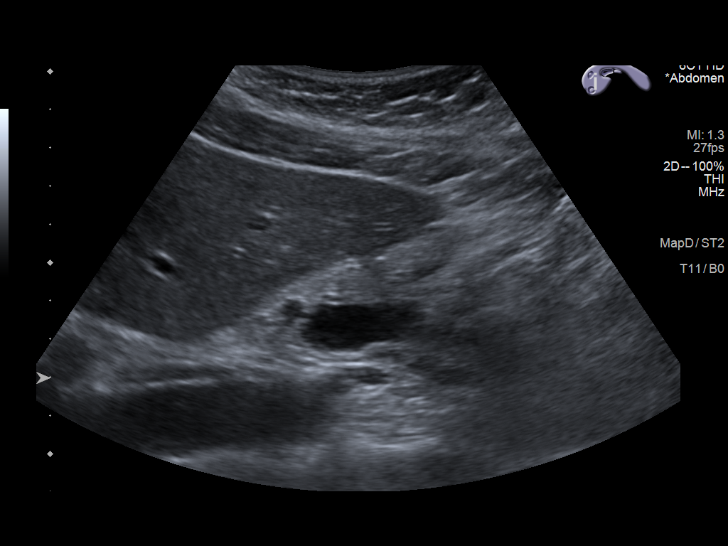
[im 57/92]
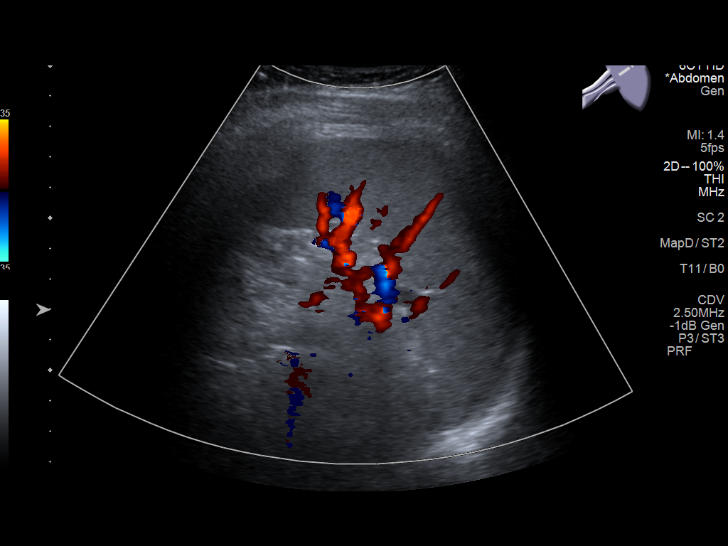
[im 61/92]
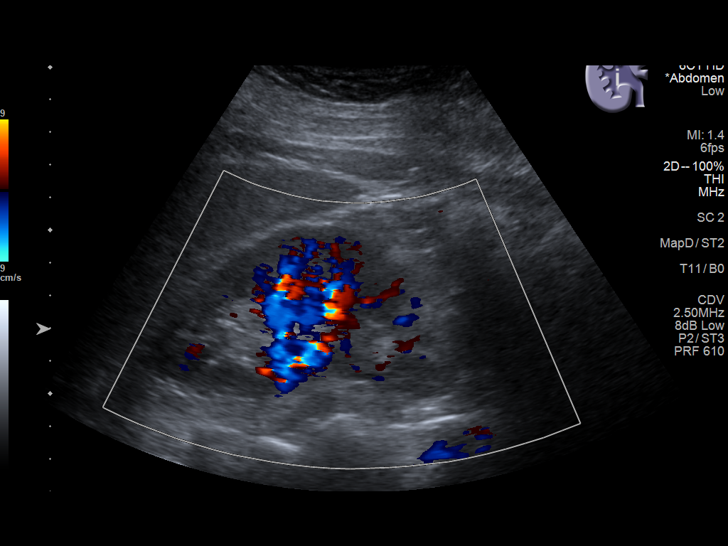
[im 69/92]
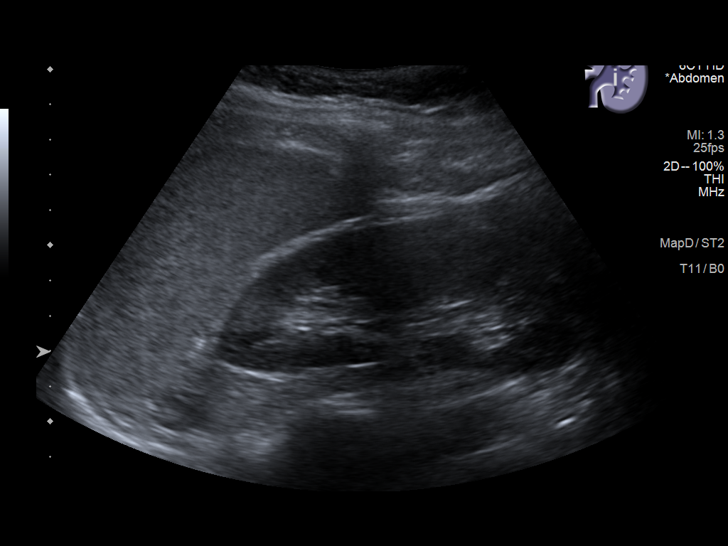
[im 76/92]
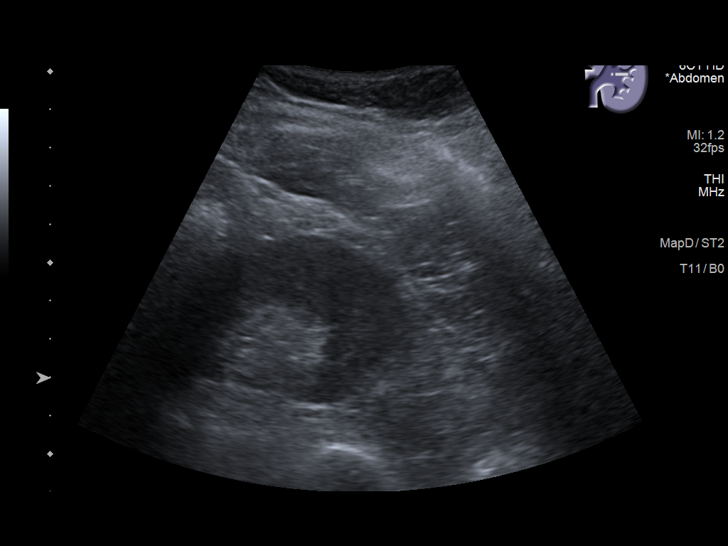
[im 84/92]
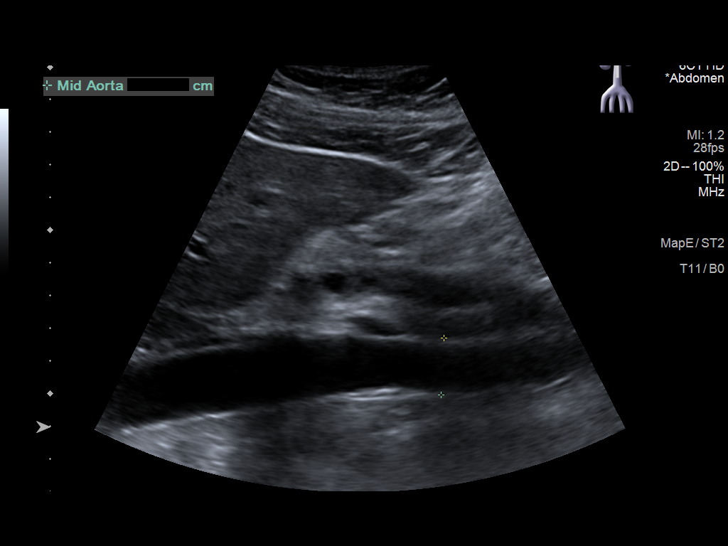
[im 92/92]
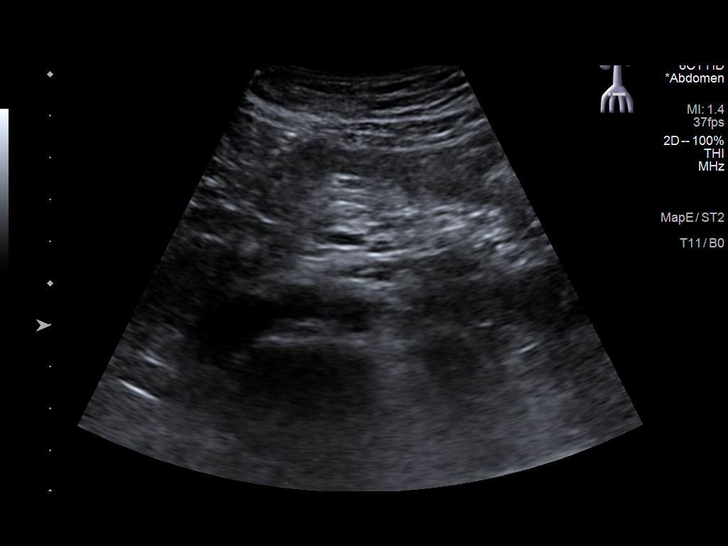

[14 of 25 positions shown; findings below may reference images not displayed]

FINDINGS: Gallbladder: No gallstones or wall thickening visualized. No
sonographic Murphy sign noted by sonographer.

Common bile duct: Diameter: 2.8 mm

Liver: No focal lesion identified. Within normal limits in
parenchymal echogenicity. Portal vein is patent on color Doppler
imaging with normal direction of blood flow towards the liver.

IVC: No abnormality visualized.

Pancreas: Visualized portion unremarkable.

Spleen: Size and appearance within normal limits.

Right Kidney: Length: 9.6 cm. Echogenicity within normal limits. No
mass or hydronephrosis visualized.

Left Kidney: Length: 9.7 cm. Echogenicity within normal limits. No
mass or hydronephrosis visualized.

Abdominal aorta: No aneurysm visualized.

Other findings: None.
IMPRESSION: Negative examination
